# Patient Record
Sex: Female | Born: 1950 | Race: White | Hispanic: No | Marital: Married | Smoking: Never smoker
Health system: Southern US, Community
[De-identification: ages and names within clinical notes are randomized; demographics above are authoritative.]

## PROBLEM LIST (undated history)

## (undated) DIAGNOSIS — C189 Malignant neoplasm of colon, unspecified: Secondary | ICD-10-CM

## (undated) DIAGNOSIS — T7840XA Allergy, unspecified, initial encounter: Secondary | ICD-10-CM

## (undated) DIAGNOSIS — N189 Chronic kidney disease, unspecified: Secondary | ICD-10-CM

## (undated) DIAGNOSIS — E039 Hypothyroidism, unspecified: Secondary | ICD-10-CM

## (undated) DIAGNOSIS — M1990 Unspecified osteoarthritis, unspecified site: Secondary | ICD-10-CM

## (undated) DIAGNOSIS — C50919 Malignant neoplasm of unspecified site of unspecified female breast: Secondary | ICD-10-CM

## (undated) DIAGNOSIS — R195 Other fecal abnormalities: Secondary | ICD-10-CM

## (undated) DIAGNOSIS — K5900 Constipation, unspecified: Secondary | ICD-10-CM

## (undated) HISTORY — DX: Malignant neoplasm of unspecified site of unspecified female breast: C50.919

## (undated) HISTORY — DX: Unspecified osteoarthritis, unspecified site: M19.90

## (undated) HISTORY — PX: TUBAL LIGATION: SHX77

## (undated) HISTORY — PX: COLON SURGERY: SHX602

## (undated) HISTORY — DX: Chronic kidney disease, unspecified: N18.9

## (undated) HISTORY — DX: Hypothyroidism, unspecified: E03.9

## (undated) HISTORY — DX: Allergy, unspecified, initial encounter: T78.40XA

## (undated) HISTORY — PX: BREAST SURGERY: SHX581

## (undated) HISTORY — DX: Malignant neoplasm of colon, unspecified: C18.9

## (undated) HISTORY — PX: TONSILLECTOMY: SUR1361

## (undated) HISTORY — PX: HERNIA REPAIR: SHX51

## (undated) HISTORY — DX: Other fecal abnormalities: R19.5

## (undated) HISTORY — DX: Constipation, unspecified: K59.00

## (undated) HISTORY — PX: CHOLECYSTECTOMY: SHX55

## (undated) HISTORY — PX: APPENDECTOMY: SHX54

---

## 2010-10-16 HISTORY — PX: COLONOSCOPY: SHX174

## 2012-01-01 DIAGNOSIS — C50919 Malignant neoplasm of unspecified site of unspecified female breast: Secondary | ICD-10-CM

## 2012-01-01 HISTORY — DX: Malignant neoplasm of unspecified site of unspecified female breast: C50.919

## 2020-01-01 DIAGNOSIS — C189 Malignant neoplasm of colon, unspecified: Secondary | ICD-10-CM | POA: Insufficient documentation

## 2020-01-01 HISTORY — DX: Malignant neoplasm of colon, unspecified: C18.9

## 2020-09-06 HISTORY — PX: COLONOSCOPY: SHX174

## 2021-07-10 ENCOUNTER — Telehealth: Payer: Self-pay | Admitting: Gastroenterology

## 2021-07-10 DIAGNOSIS — Z85038 Personal history of other malignant neoplasm of large intestine: Secondary | ICD-10-CM

## 2021-07-10 DIAGNOSIS — Z800 Family history of malignant neoplasm of digestive organs: Secondary | ICD-10-CM

## 2021-07-10 NOTE — Telephone Encounter (Signed)
Good morning Dr. Bryan Lemma, patient is requesting to schedule an colonoscopy. Her last colon was 08/2020.  Will be faxing records to [facility] location.  Can you please review and advise on scheduling?  Thank you.

## 2021-07-24 NOTE — Telephone Encounter (Signed)
Per Jabier Gauss, she is resending notes to [facility] office for review

## 2021-07-24 NOTE — Telephone Encounter (Signed)
Patient calling to follow up on record review status.

## 2021-08-07 NOTE — Telephone Encounter (Signed)
I put the records at Dr. Bryan Lemma desk to review.

## 2021-08-15 ENCOUNTER — Telehealth: Payer: Self-pay

## 2021-08-15 NOTE — Telephone Encounter (Signed)
Records received and reviewed and notable for the following:  - Colonoscopy (10/16/2010): Moderate diverticulosis sigmoid colon, grade 1 internal hemorrhoids - Colonoscopy (09/06/2020): 3 cm ulcerating mass in the proximal ascending colon.  Pathology: Invasive adenocarcinoma.  15 mm polyp adjacent to the mass, not resected endoscopically as this would be resected surgically with the mass.  Moderate diverticulosis of the sigmoid, grade 1 internal hemorrhoids - Labs from 09/07/2020: Normal CBC.  CEA 1.7.  Normal CMP (BG 116) - CT abdomen/pelvis (09/08/2020): Liver unremarkable, normal spleen, pancreas.  No evidence of metastasis.  Nodular thickening of ascending colon consistent with history of colon carcinoma. - Robotic Right hemicolectomy (09/12/2020): Path with invasive, moderately differentiated adenocarcinoma measuring 2.6 cm.  Tumor invades submucosa.  12 lymph nodes negative for metastatic carcinoma.  Margins negative for adenoma and malignancy. - 11/11/2020: GI follow-up.  No GI complaints at that time.  Recommended repeat colonoscopy in 08/2021  Past surgical history: - Hernia surgery - Hemorrhoidectomy - Cholecystectomy - Tonsillectomy - Tubal ligation - Right hemicolectomy as above  Family history notable for the following: - Maternal aunt with Crohn's disease/ulcerative colitis - Grandfather with colon cancer  Recommend OV with me or one of the APP's.  Would also benefit from Oncology Clinic referral for 1 year follow-up care.  Gerrit Heck, DO, [facility]

## 2021-08-15 NOTE — Telephone Encounter (Signed)
Called the patient to make an appointment with Dr. Bryan Lemma, or one of the APPS's.

## 2021-08-15 NOTE — Telephone Encounter (Signed)
Opened in an error

## 2021-08-29 ENCOUNTER — Encounter: Payer: Self-pay | Admitting: *Deleted

## 2021-08-31 NOTE — Addendum Note (Signed)
Addended by: Curlene Labrum E on: 08/31/2021 08:55 AM   Modules accepted: Orders

## 2021-09-11 ENCOUNTER — Encounter: Payer: Self-pay | Admitting: *Deleted

## 2021-09-11 NOTE — Progress Notes (Signed)
Patient has a known diagnosis of colon cancer. She is relocating to this area and needs to establish care with a local oncologist. She is already set up with GI and has a colonoscopy scheduled for 09/22/2021. Will schedule patient for one week later to allow for any possible biopsy results.   Reached out to [employer] to introduce myself as the office RN Navigator and explain our new patient process.   Patient expressed confusion as she wants to see Dr Benay Spice. Explained that her referral was sent to this office as she lives in [place]. She declined and would like to see Dr Benay Spice. Message sent to that navigator, Tylene Fantasia.   Oncology Nurse Navigator Documentation  Oncology Nurse Navigator Flowsheets 09/11/2021  Navigator Location [facility]  Referral Date to RadOnc/MedOnc 09/06/2021  Navigator Encounter Type Introductory Phone Call  Patient Visit Type MedOnc  Treatment Phase Post-Tx Follow-up  Barriers/Navigation Needs Coordination of Care;Education  Education Other  Interventions Coordination of Care;Education  Acuity Level 2-Minimal Needs (1-2 Barriers Identified)  Coordination of Care Appts  Education Method Verbal;Written  Time Spent with Patient 45

## 2021-09-12 ENCOUNTER — Encounter: Payer: Self-pay | Admitting: *Deleted

## 2021-09-14 ENCOUNTER — Ambulatory Visit (INDEPENDENT_AMBULATORY_CARE_PROVIDER_SITE_OTHER): Payer: Medicare Other | Admitting: Gastroenterology

## 2021-09-14 ENCOUNTER — Encounter: Payer: Self-pay | Admitting: Gastroenterology

## 2021-09-14 ENCOUNTER — Other Ambulatory Visit: Payer: Self-pay

## 2021-09-14 VITALS — BP 132/82 | HR 64 | Wt 173.2 lb

## 2021-09-14 DIAGNOSIS — K5900 Constipation, unspecified: Secondary | ICD-10-CM | POA: Diagnosis not present

## 2021-09-14 DIAGNOSIS — R194 Change in bowel habit: Secondary | ICD-10-CM

## 2021-09-14 DIAGNOSIS — Z85038 Personal history of other malignant neoplasm of large intestine: Secondary | ICD-10-CM

## 2021-09-14 DIAGNOSIS — K921 Melena: Secondary | ICD-10-CM | POA: Diagnosis not present

## 2021-09-14 MED ORDER — CLENPIQ 10-3.5-12 MG-GM -GM/160ML PO SOLN: 1.0000 | Freq: Once | ORAL | 0 refills | Status: AC

## 2021-09-14 NOTE — Progress Notes (Signed)
Chief Complaint: Symptomatic hemorrhoids, hematochezia, hx of colon cancer    HPI:     Amanda Dickson is a 71 y.o. female with a history of hypothyroidism, breast cancer s/p lumpectomy, colon cancer  s/p robotic right hemicolectomy in 08/2020 as outlined below, prior hemorrhoidectomy, cholecystectomy, tubal ligation, hernia surgery, presenting to the Gastroenterology Clinic for evaluation of hematochezia and colon cancer surveillance.   1 BM last week with BRBPR filling toilet water. No recurrence. Has been having rectal itching that she attributes to history of hemorrhoids.  Does have a history of constipation.  She takes stool softeners, but feels these are less efficacious lately.  Will occasionally have watery, nonbloody stools.  Symptoms seem more common since right hemicolectomy last year.    Of note, she is under high amounts of stress.  Just moved from [place], renovating new home here in [place].  Just drove back/forth to [place] over the last 2 weeks.  Does report decreased water intake.   Scheduled for colonoscopy on 09/22/2021.  Prior pertinent GI history: - Colonoscopy (10/16/2010): Moderate diverticulosis sigmoid colon, grade 1 internal hemorrhoids - Colonoscopy (09/06/2020): 3 cm ulcerating mass in the proximal ascending colon.  Pathology: Invasive adenocarcinoma.  15 mm polyp adjacent to the mass, not resected endoscopically as this would be resected surgically with the mass.  Moderate diverticulosis of the sigmoid, grade 1 internal hemorrhoids - Labs from 09/07/2020: Normal CBC.  CEA 1.7.  Normal CMP (BG 116) - CT abdomen/pelvis (09/08/2020): Liver unremarkable, normal spleen, pancreas.  No evidence of metastasis.  Nodular thickening of ascending colon consistent with history of colon carcinoma. - Robotic Right hemicolectomy (09/12/2020): Path with invasive, moderately differentiated adenocarcinoma measuring 2.6 cm.  Tumor invades submucosa.  12 lymph nodes negative for metastatic  carcinoma.  Margins negative for adenoma and malignancy. - 11/11/2020: GI follow-up.  No GI complaints at that time.  Recommended repeat colonoscopy in 08/2021  Family history notable for the following: - Maternal aunt with Crohn's disease/ulcerative colitis - Grandfather with colon cancer   Past Medical History:  Diagnosis Date   Breast cancer [facility]) 2013   Right, s/p lumpectomy with brachytherapy   Colon cancer ([facility]) 2021   Hypothyroidism      Past Surgical History:  Procedure Laterality Date   CHOLECYSTECTOMY     COLONOSCOPY  09/06/2020   Dr. Larkin Ina - [facility]. Moderate diverticulosis of the sigmoid colon, Mass (3cm) in the proximal ascending colon.   COLONOSCOPY  10/16/2010   Dr. Larkin Ina - [facility] Moderate diverticulosis sigmoid colon, grade 1 internal hemorrhoids   HERNIA REPAIR     TONSILLECTOMY     TUBAL LIGATION     Family History  Problem Relation Age of Onset   Prostate cancer Father    Liver cancer Father    Colon cancer Maternal Grandfather    Crohn's disease Maternal Aunt        Ulcerative colitis   Rectal cancer Neg Hx    Esophageal cancer Neg Hx    Pancreatic cancer Neg Hx    Social History   Tobacco Use   Smoking status: Never   Smokeless tobacco: Never  Vaping Use   Vaping Use: Never used  Substance Use Topics   Alcohol use: Not Currently   Drug use: Never   Current Outpatient Medications  Medication Sig Dispense Refill   calcium gluconate 500 MG tablet Take by mouth. 2 tablets daily  cholecalciferol (VITAMIN D3) 25 MCG (1000 UNIT) tablet Take 1,000 Units by mouth daily.     Docusate Sodium 100 MG capsule Take 100 mg by mouth daily.     FIBER ADULT GUMMIES PO Take by mouth. daily     levothyroxine (SYNTHROID) 50 MCG tablet Take 50 mcg by mouth daily before breakfast.     Multiple Vitamin (MULTIVITAMIN) tablet Take 1 tablet by mouth daily.     Probiotic Product (PROBIOTIC DAILY PO) Take by mouth.  Take one tablet daily     No current facility-administered medications for this visit.   Allergies  Allergen Reactions   Demerol [Meperidine Hcl]     "Feel paralyzed"     Review of Systems: All systems reviewed and negative except where noted in HPI.     Physical Exam:    Wt Readings from Last 3 Encounters:  09/14/21 173 lb 4 oz (78.6 kg)    BP 132/82   Pulse 64   Wt 173 lb 4 oz (78.6 kg)   SpO2 98%  Constitutional:  Pleasant, in no acute distress. Psychiatric: Normal mood and affect. Behavior is normal. Cardiovascular: Normal rate, regular rhythm. No edema Pulmonary/chest: Effort normal and breath sounds normal. No wheezing, rales or rhonchi. Abdominal: Soft, nondistended, nontender. Bowel sounds active throughout. There are no masses palpable. No hepatomegaly. Neurological: Alert and oriented to person place and time. Skin: Skin is warm and dry. No rashes noted.   ASSESSMENT AND PLAN;   1) History of colon cancer s/p right hemicolectomy - Colonoscopy next week for colon cancer surveillance - Has appoint with Dr. Benay Spice on 09/18/2021 to establish care and 1 year follow-up from prior hemicolectomy - Extended bowel preparation with Dulcolax 10 mg bid x2 days, MiraLAX 1 cap bid x2 days, then Clenpiq  2) Constipation 3) Change in bowel habits - Longstanding history of constipation.  Suspect episodic overflow diarrhea based on clinical description - Recent changes in bowel habits could also be related to increased stress loads, long distance travel, etc. - Increase daily water intake to at least 64 ounces of water/day - Trial MiraLAX 1 cap/day and titrate to effect - Plan for extended bowel prep  4) Hematochezia - Evaluate for etiology at time of colonoscopy as above - If clinically significant hemorrhoidal disease, discussed hemorrhoid banding today  The indications, risks, and benefits of colonoscopy were explained to the patient in detail. Risks include but are  not limited to bleeding, perforation, adverse reaction to medications, and cardiopulmonary compromise. Sequelae include but are not limited to the possibility of surgery, hospitalization, and mortality. The patient verbalized understanding and wished to proceed.    [place], DO, FACG  09/14/2021, 2:11 PM   No ref. provider found

## 2021-09-14 NOTE — Patient Instructions (Addendum)
If you are age 73 or older, your body mass index should be between 23-30. Your There is no height or weight on file to calculate BMI. If this is out of the aforementioned range listed, please consider follow up with your Primary Care Provider.  If you are age 33 or younger, your body mass index should be between 19-25. Your There is no height or weight on file to calculate BMI. If this is out of the aformentioned range listed, please consider follow up with your Primary Care Provider.   __________________________________________________________  The [facility] providers would like to encourage you to use [facility] to communicate with providers for non-urgent requests or questions.  Due to long hold times on the telephone, sending your provider a message by [facility] may be a faster and more efficient way to get a response.  Please allow 48 business hours for a response.  Please remember that this is for non-urgent requests.   You have been scheduled for a colonoscopy. Please follow written instructions given to you at your visit today.  Please pick up your prep supplies at the pharmacy within the next 1-3 days. If you use inhalers (even only as needed), please bring them with you on the day of your procedure.   We have given you samples of the following medication to take:  Clenpiq   Dulcolax 10 mg twice daily 2 days prior to procedure  Miralax 1 capful twice daily 2 days prior to procedure.   Please purchase the following medications over the counter and take as directed: Miralax 1 capful once daily starting today.    It was a pleasure to see you today!  Vito Cirigliano, D.O.

## 2021-09-18 ENCOUNTER — Inpatient Hospital Stay: Payer: Medicare Other | Attending: Oncology | Admitting: Oncology

## 2021-09-18 ENCOUNTER — Other Ambulatory Visit: Payer: Self-pay

## 2021-09-18 VITALS — BP 139/74 | HR 73 | Temp 98.1°F | Resp 20 | Ht 65.0 in | Wt 171.6 lb

## 2021-09-18 DIAGNOSIS — C50411 Malignant neoplasm of upper-outer quadrant of right female breast: Secondary | ICD-10-CM | POA: Diagnosis not present

## 2021-09-18 DIAGNOSIS — Z9289 Personal history of other medical treatment: Secondary | ICD-10-CM

## 2021-09-18 DIAGNOSIS — Z1231 Encounter for screening mammogram for malignant neoplasm of breast: Secondary | ICD-10-CM

## 2021-09-18 DIAGNOSIS — Z170 Estrogen receptor positive status [ER+]: Secondary | ICD-10-CM

## 2021-09-18 NOTE — Progress Notes (Signed)
[facility] New Patient Consult   Requesting MD: Raliegh Ip [address] [place],  [place] [address]   Dillan Lunden 71 y.o.  [birth year]    Reason for Consult: Breast cancer, colon cancer   HPI: Ms. Kirsh has a remote history of breast cancer and was diagnosed with colon cancer last year.  She recently relocated to [facility] from [place].  She is referred to establish oncology care.  She feels well.  She is scheduled for colonoscopy later this week.  A bilateral mammogram on 06/23/2021 was negative.  Past Medical History:  Diagnosis Date   Breast cancer ([facility]) stage Ia, T1N0, ER positive, PR positive, HER2 negative 2013   Right, s/p lumpectomy with brachytherapy   Colon cancer ([facility])-proximal right colon, stage I, T1N0 2021   Hypothyroidism     .  G2 P2  Past Surgical History:  Procedure Laterality Date   CHOLECYSTECTOMY     COLONOSCOPY  09/06/2020   Dr. Larkin Ina - [facility]. Moderate diverticulosis of the sigmoid colon, Mass (3cm) in the proximal ascending colon.   COLONOSCOPY  10/16/2010   Dr. Larkin Ina - [facility]. Moderate diverticulosis sigmoid colon, grade 1 internal hemorrhoids   HERNIA REPAIR     TONSILLECTOMY     TUBAL LIGATION      .  Partial colectomy 2021   .  Hemorrhoidectomy 1988  Medications: Reviewed  Allergies:  Allergies  Allergen Reactions   Meperidine Hcl     "Feel paralyzed" Other reaction(s): Other (See Comments) PT STATES IT CAUSES HER TO FEEL LIKE PARALYSIS IS SETTING IN    Family history: Her father had prostate cancer and may have had colon cancer.  Social History:   She lives with her husband and [employer].  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  She does not use cigarettes or alcohol.  She received a transfusion with the cholecystectomy.  No risk factor for HIV or hepatitis.  She has received  COVID-19 vaccines  ROS:    Positives include: Chronic constipation/diarrhea, episode of bright red blood per rectum 1 week ago  A complete ROS was otherwise negative.  Physical Exam:  Blood pressure 139/74, pulse 73, temperature 98.1 F (36.7 C), temperature source Oral, resp. rate 20, height $RemoveBe'5\' 5"'xQstHJsja$  (1.651 m), weight 171 lb 9.6 oz (77.8 kg), SpO2 98 %.  Lungs: Clear bilaterally Cardiac: Regular rate and rhythm Abdomen: No hepatosplenomegaly, no mass, nontender  Vascular: No leg edema Lymph nodes: No cervical, supraclavicular, axillary, or inguinal nodes Neurologic: Alert and oriented Skin: No rash Musculoskeletal: No spine tenderness Breast: Status post right lumpectomy.  Firm tissue surrounding lumpectomy scar.  No mass in either breast.  Both axillae appear benign.       Assessment/Plan:   Right breast cancer-stage Ia (T1N0), status post a right lumpectomy and sentinel lymph node biopsy March 2013 ER 100%, PR 99%, HER2 0 Adjuvant brachytherapy March 2013 Adjuvant aromatase inhibitors including Femara, Aromasin, and Arimidex March 2013-March 2015-discontinued secondary to severe hot flashes and rash, trial of tamoxifen discontinued secondary to severe hot flashes Right colon cancer, stage I, T1N0-status post a right colectomy 09/12/2020, tumor invaded the submucosa, 0/12 lymph nodes, no lymphovascular perineural invasion, mismatch repair protein proficient CT abdomen/pelvis negative for metastases  3.   Negative INVITAE genetic panel 4.   Hypothyroidism 5.   G2 P2  Disposition:   Ms. Whobrey has a history of right-sided breast cancer and  colon cancer.  She is in clinical remission from both cancers.  She will be scheduled for a bilateral mammogram in June 2023.  She will undergo colonoscopy surveillance later this week.  She would like to continue follow-up in the medical oncology clinic.  She will return for an office visit in 1 year.  Betsy Coder, MD  09/18/2021, 2:29 PM

## 2021-09-22 ENCOUNTER — Ambulatory Visit (AMBULATORY_SURGERY_CENTER): Payer: Medicare Other | Admitting: Gastroenterology

## 2021-09-22 ENCOUNTER — Encounter: Payer: Self-pay | Admitting: Gastroenterology

## 2021-09-22 ENCOUNTER — Other Ambulatory Visit: Payer: Self-pay

## 2021-09-22 VITALS — BP 137/84 | HR 57 | Temp 97.0°F | Resp 11 | Ht 65.0 in | Wt 171.0 lb

## 2021-09-22 DIAGNOSIS — K921 Melena: Secondary | ICD-10-CM

## 2021-09-22 DIAGNOSIS — K5730 Diverticulosis of large intestine without perforation or abscess without bleeding: Secondary | ICD-10-CM | POA: Diagnosis not present

## 2021-09-22 DIAGNOSIS — K641 Second degree hemorrhoids: Secondary | ICD-10-CM

## 2021-09-22 DIAGNOSIS — Z85038 Personal history of other malignant neoplasm of large intestine: Secondary | ICD-10-CM

## 2021-09-22 DIAGNOSIS — K5900 Constipation, unspecified: Secondary | ICD-10-CM

## 2021-09-22 MED ORDER — SODIUM CHLORIDE 0.9 % IV SOLN: 500.0000 mL | Freq: Once | INTRAVENOUS | Status: DC

## 2021-09-22 NOTE — Progress Notes (Signed)
Pt's states no medical or surgical changes since previsit or office visit. 

## 2021-09-22 NOTE — Progress Notes (Signed)
Report given to PACU, vss 

## 2021-09-22 NOTE — Patient Instructions (Signed)
Handout given:  diverticulosis Resume previous diet Continue current medications Use supplemental fiber ie:  citrucel, fibercon, konsyl or metamucil  YOU HAD AN ENDOSCOPIC PROCEDURE TODAY AT [facility]:   Refer to the procedure report that was given to you for any specific questions about what was found during the examination.  If the procedure report does not answer your questions, please call your gastroenterologist to clarify.  If you requested that your care partner not be given the details of your procedure findings, then the procedure report has been included in a sealed envelope for you to review at your convenience later.  YOU SHOULD EXPECT: Some feelings of bloating in the abdomen. Passage of more gas than usual.  Walking can help get rid of the air that was put into your GI tract during the procedure and reduce the bloating. If you had a lower endoscopy (such as a colonoscopy or flexible sigmoidoscopy) you may notice spotting of blood in your stool or on the toilet paper. If you underwent a bowel prep for your procedure, you may not have a normal bowel movement for a few days.  Please Note:  You might notice some irritation and congestion in your nose or some drainage.  This is from the oxygen used during your procedure.  There is no need for concern and it should clear up in a day or so.  SYMPTOMS TO REPORT IMMEDIATELY:  Following lower endoscopy (colonoscopy or flexible sigmoidoscopy):  Excessive amounts of blood in the stool  Significant tenderness or worsening of abdominal pains  Swelling of the abdomen that is new, acute  Fever of 100F or higher  For urgent or emergent issues, a gastroenterologist can be reached at any hour by calling [phone]. Do not use MyChart messaging for urgent concerns.   DIET:  We do recommend a small meal at first, but then you may proceed to your regular diet.  Drink plenty of fluids but you should avoid alcoholic beverages for  24 hours.  ACTIVITY:  You should plan to take it easy for the rest of today and you should NOT DRIVE or use heavy machinery until tomorrow (because of the sedation medicines used during the test).    FOLLOW UP: Our staff will call the number listed on your records 48-72 hours following your procedure to check on you and address any questions or concerns that you may have regarding the information given to you following your procedure. If we do not reach you, we will leave a message.  We will attempt to reach you two times.  During this call, we will ask if you have developed any symptoms of COVID 19. If you develop any symptoms (ie: fever, flu-like symptoms, shortness of breath, cough etc.) before then, please call [phone].  If you test positive for Covid 19 in the 2 weeks post procedure, please call and report this information to [place].    If any biopsies were taken you will be contacted by phone or by letter within the next 1-3 weeks.  Please call us at [phone] if you have not heard about the biopsies in 3 weeks.   SIGNATURES/CONFIDENTIALITY: You and/or your care partner have signed paperwork which will be entered into your electronic medical record.  These signatures attest to the fact that that the information above on your After Visit Summary has been reviewed and is understood.  Full responsibility of the confidentiality of this discharge information lies with you and/or your care-partner.

## 2021-09-22 NOTE — Op Note (Signed)
[facility] Patient Name: Amanda Dickson Procedure Date: 09/22/2021 7:47 AM MRN: [id number] Endoscopist: Gerrit Heck , MD Age: 71 Referring MD:  Date of Birth: 1950/10/21 Gender: Female Account #: [id number] Procedure:                Colonoscopy Indications:              High risk colon cancer surveillance: Personal                            history of colon cancer                           Additionally, history of constipation and recent                            episode of hematochezia.                           ?" Colonoscopy (10/16/2010): Moderate diverticulosis                            sigmoid colon, grade 1 internal hemorrhoids                           ?"Colonoscopy (09/06/2020): 3 cm ulcerating mass in                            the proximal ascending colon. Pathology: Invasive                            adenocarcinoma. 15 mm polyp adjacent to the mass,                            not resected endoscopically as this would be                            resected surgically with the mass. Moderate                            diverticulosis of the sigmoid, grade 1 internal                            hemorrhoids                           ?" Labs from 09/07/2020: Normal CBC. CEA 1.7. Normal                            CMP (BG 116)                           ?" CT abdomen/pelvis (09/08/2020): Liver unremarkable,                            normal spleen, pancreas. No evidence of  metastasis. Nodular thickening of ascending colon                            consistent with history of colon carcinoma.                           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 Tumor invades                            submucosa. 12 lymph nodes negative for metastatic                            carcinoma.  Margins negative for adenoma and                            malignancy. Medicines:                Monitored Anesthesia Care Procedure:                Pre-Anesthesia Assessment:                           - Prior to the procedure, a History and Physical                            was performed, and patient medications and                            allergies were reviewed. The patient's tolerance of                            previous anesthesia was also reviewed. The risks                            and benefits of the procedure and the sedation                            options and risks were discussed with the patient.                            All questions were answered, and informed consent                            was obtained. Prior Anticoagulants: The patient has                            taken no previous anticoagulant or antiplatelet  agents. ASA Grade Assessment: II - A patient with                            mild systemic disease. After reviewing the risks                            and benefits, the patient was deemed in                            satisfactory condition to undergo the procedure.                           After obtaining informed consent, the colonoscope                            was passed under direct vision. Throughout the                            procedure, the patient's blood pressure, pulse, and                            oxygen saturations were monitored continuously. The                            CF [id number] #[id number] was introduced through the anus                            and advanced to the the ileocolonic anastomosis.                            The colonoscopy was performed without difficulty.                            The patient tolerated the procedure well. The                            quality of the bowel preparation was good. The                            rectum, ileocolonic anastamosis and neo-terminal                             ileum were photographed. Scope In: 7:56:57 AM Scope Out: 8:13:25 AM Scope Withdrawal Time: 0 hours 13 minutes 19 seconds  Total Procedure Duration: 0 hours 16 minutes 28 seconds  Findings:                 Hemorrhoids and skin tags were found on perianal                            exam.                           There was evidence of a prior end-to-side  ileo-colonic anastomosis in the transverse colon.                            This was patent and was characterized by healthy                            appearing mucosa. The anastomosis was traversed.                           A few small-mouthed diverticula were found in the                            sigmoid colon and descending colon.                           Non-bleeding internal hemorrhoids were found during                            retroflexion. The hemorrhoids were small and Grade                            II (internal hemorrhoids that prolapse but reduce                            spontaneously).                           The neo-terminal ileum appeared normal. Complications:            No immediate complications. Estimated Blood Loss:     Estimated blood loss: none. Impression:               - Hemorrhoids found on perianal exam.                           - Patent end-to-side ileo-colonic anastomosis,                            characterized by healthy appearing mucosa.                           - Diverticulosis in the sigmoid colon and in the                            descending colon.                           - Non-bleeding internal hemorrhoids.                           - The examined portion of the ileum was normal.                           - No specimens collected. Recommendation:           - Patient has a contact number available for  emergencies. The signs and symptoms of potential                            delayed complications were  discussed with the                            patient. Return to normal activities tomorrow.                            Written discharge instructions were provided to the                            patient.                           - Resume previous diet.                           - Continue present medications.                           - Repeat colonoscopy in 3 years for surveillance.                           - Return to GI office PRN.                           - Use fiber, for example Citrucel, Fibercon, Konsyl                            or Metamucil.                           - Internal hemorrhoids were noted on this study and                            may be amenable to hemorrhoid band ligation. If you                            are interested in further treatment of these                            hemorrhoids with band ligation, please contact my                            clinic to set up an appointment for evaluation and                            treatment. Gerrit Heck, MD 09/22/2021 8:22:22 AM

## 2021-09-22 NOTE — Progress Notes (Signed)
GASTROENTEROLOGY PROCEDURE H&P NOTE   Primary Care Physician: Luetta Nutting, DO    Reason for Procedure:  Colon cancer surveillance, hematochezia, constipation  Plan:    Colonoscopy  Patient is appropriate for endoscopic procedure(s) in the ambulatory ([place]) setting.  The nature of the procedure, as well as the risks, benefits, and alternatives were carefully and thoroughly reviewed with the patient. Ample time for discussion and questions allowed. The patient understood, was satisfied, and agreed to proceed.     HPI: Amanda Dickson is a 71 y.o. female with a history of colon cancer diagnosed in 08/2020,  s/p robotic right hemicolectomy 08/2020 (no chemotherapy or radiation), who presents for colonoscopy for ongoing colon cancer surveillance.  Patient was most recently seen in the Gastroenterology Clinic on 09/14/2021.  Has since been seen in the Oncology clinic on 09/18/2021, with plan for 1 year follow-up.  Otherwise, no interval change in medical history since that appointment. Please refer to that note for full details regarding GI history and clinical presentation.   Past Medical History:  Diagnosis Date   Breast cancer [facility]) 2013   Right, s/p lumpectomy with brachytherapy   Colon cancer [facility]) 2021   Hypothyroidism     Past Surgical History:  Procedure Laterality Date   CHOLECYSTECTOMY     COLONOSCOPY  09/06/2020   Dr. Larkin Ina - [facility]. Moderate diverticulosis of the sigmoid colon, Mass (3cm) in the proximal ascending colon.   COLONOSCOPY  10/16/2010   Dr. Larkin Ina - [facility]. Moderate diverticulosis sigmoid colon, grade 1 internal hemorrhoids   HERNIA REPAIR     TONSILLECTOMY     TUBAL LIGATION      Prior to Admission medications   Medication Sig Start Date End Date Taking? Authorizing Provider  calcium gluconate 500 MG tablet Take by mouth. 2 tablets daily   Yes [provider]  cholecalciferol (VITAMIN D3) 25  MCG (1000 UNIT) tablet Take 1,000 Units by mouth daily.   Yes [provider]  Fiber Adult Gummies 2 g CHEW Chew 2 each by mouth daily.   Yes [provider]  levothyroxine (SYNTHROID) 50 MCG tablet Take 50 mcg by mouth daily before breakfast.   Yes [provider]  Multiple Vitamins-Minerals (MULTI-VITAMIN GUMMIES) CHEW Chew 2 tablets by mouth daily.   Yes [provider]  polyethylene glycol (MIRALAX / GLYCOLAX) 17 g packet Take 17 g by mouth daily.   Yes [provider]  Probiotic Product (PROBIOTIC DAILY PO) Take by mouth. Take one capsule daily   Yes [provider]    Current Outpatient Medications  Medication Sig Dispense Refill   calcium gluconate 500 MG tablet Take by mouth. 2 tablets daily     cholecalciferol (VITAMIN D3) 25 MCG (1000 UNIT) tablet Take 1,000 Units by mouth daily.     Fiber Adult Gummies 2 g CHEW Chew 2 each by mouth daily.     levothyroxine (SYNTHROID) 50 MCG tablet Take 50 mcg by mouth daily before breakfast.     Multiple Vitamins-Minerals (MULTI-VITAMIN GUMMIES) CHEW Chew 2 tablets by mouth daily.     polyethylene glycol (MIRALAX / GLYCOLAX) 17 g packet Take 17 g by mouth daily.     Probiotic Product (PROBIOTIC DAILY PO) Take by mouth. Take one capsule daily     Current Facility-Administered Medications  Medication Dose Route Frequency Provider Last Rate Last Admin   0.9 %  sodium chloride infusion  500 mL Intravenous Once Katheren Jimmerson, [place], DO  Allergies as of 09/22/2021 - Review Complete 09/22/2021  Allergen Reaction Noted   Meperidine hcl  03/17/2012    Family History  Problem Relation Age of Onset   Prostate cancer Father    Liver cancer Father    Crohn's disease Maternal Aunt        Ulcerative colitis   Colon cancer Maternal Grandfather    Rectal cancer Neg Hx    Esophageal cancer Neg Hx    Pancreatic cancer Neg Hx    Stomach cancer Neg Hx     Social History   Socioeconomic  History   Marital status: Married    Spouse name: Not on file   Number of children: 2   Years of education: Not on file   Highest education level: Not on file  Occupational History   Occupation: Retired   Occupation: Retired  Tobacco Use   Smoking status: Never   Smokeless tobacco: Never  Scientific laboratory technician Use: Never used  Substance and Sexual Activity   Alcohol use: Not Currently   Drug use: Never   Sexual activity: Not on file  Other Topics Concern   Not on file  Social History Narrative   Not on file   Social Determinants of Health   Financial Resource Strain: Not on file  Food Insecurity: Not on file  Transportation Needs: Not on file  Physical Activity: Not on file  Stress: Not on file  Social Connections: Not on file  Intimate Partner Violence: Not on file    Physical Exam: Vital signs in last 24 hours: @BP  (!) 149/94   Pulse (!) 58   Temp (!) 97 F (36.1 C) (Temporal)   Ht 5\' 5"  (1.651 m)   Wt 171 lb (77.6 kg)   LMP  (Exact Date)   SpO2 98%   BMI 28.46 kg/m  GEN: NAD EYE: Sclerae anicteric ENT: MMM CV: Non-tachycardic Pulm: CTA b/l GI: Soft, NT/ND NEURO:  Alert & Oriented x [place], DO [facility]   09/22/2021 7:35 AM

## 2021-09-25 ENCOUNTER — Telehealth: Payer: Self-pay

## 2021-09-25 NOTE — Telephone Encounter (Signed)
  Follow up Call-  Call back number 09/22/2021  Post procedure Call Back phone  # [phone]  Permission to leave phone message Yes     Patient questions:  Do you have a fever, pain , or abdominal swelling? No. Pain Score  0 *  Have you tolerated food without any problems? Yes.    Have you been able to return to your normal activities? Yes.    Do you have any questions about your discharge instructions: Diet   No. Medications  No. Follow up visit  No.  Do you have questions or concerns about your Care? No.  Actions: * If pain score is 4 or above: No action needed, pain <4.

## 2021-09-29 ENCOUNTER — Ambulatory Visit: Payer: Self-pay | Admitting: Hematology & Oncology

## 2021-09-29 ENCOUNTER — Other Ambulatory Visit: Payer: Self-pay

## 2021-10-18 ENCOUNTER — Ambulatory Visit (INDEPENDENT_AMBULATORY_CARE_PROVIDER_SITE_OTHER): Payer: Medicare Other | Admitting: Sports Medicine

## 2021-10-18 ENCOUNTER — Other Ambulatory Visit: Payer: Self-pay

## 2021-10-18 ENCOUNTER — Ambulatory Visit (INDEPENDENT_AMBULATORY_CARE_PROVIDER_SITE_OTHER): Payer: Medicare Other

## 2021-10-18 DIAGNOSIS — G8929 Other chronic pain: Secondary | ICD-10-CM

## 2021-10-18 DIAGNOSIS — M25552 Pain in left hip: Secondary | ICD-10-CM

## 2021-10-18 HISTORY — DX: Pain in left hip: M25.552

## 2021-10-18 MED ORDER — CELECOXIB 200 MG PO CAPS: ORAL_CAPSULE | ORAL | 2 refills | Status: DC

## 2021-10-18 NOTE — Assessment & Plan Note (Signed)
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 She does endorse that some of the pain goes down to her lateral ankle. On exam she has tenderness over the greater trochanter on the left but similar so on the right. She does have significant loss of motion with internal rotation and reproduction of pain on the left compared to the right. 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 Return to see me in 4 to 6 weeks, hip joint injection if no better, if all of the above fails we will consider her lumbar spine as a pain generator.

## 2021-10-18 NOTE — Progress Notes (Signed)
    Procedures performed today:    None.  Independent interpretation of notes and tests performed by another provider:   None.  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 She does endorse that some of the pain goes down to her lateral ankle. On exam she has tenderness over the greater trochanter on the left but similar so on the right. She does have significant loss of motion with internal rotation and reproduction of pain on the left compared to the right. 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 Return to see me in 4 to 6 weeks, hip joint injection if no better, if all of the above fails we will consider her lumbar spine as a pain generator.  Chronic process with exacerbation and pharmacologic intervention  ___________________________________________ Gwen Her. Dianah Field, M.D., ABFM., CAQSM. Primary Care and [place] Instructor of [place] of [facility] of Medicine

## 2021-10-23 ENCOUNTER — Other Ambulatory Visit: Payer: Self-pay

## 2021-10-23 ENCOUNTER — Encounter: Payer: Self-pay | Admitting: Family Medicine

## 2021-10-23 ENCOUNTER — Ambulatory Visit (INDEPENDENT_AMBULATORY_CARE_PROVIDER_SITE_OTHER): Payer: Medicare Other | Admitting: Family Medicine

## 2021-10-23 ENCOUNTER — Ambulatory Visit: Payer: Self-pay | Admitting: Family Medicine

## 2021-10-23 VITALS — BP 146/78 | HR 58 | Temp 97.8°F | Ht 65.16 in | Wt 172.1 lb

## 2021-10-23 DIAGNOSIS — Z85038 Personal history of other malignant neoplasm of large intestine: Secondary | ICD-10-CM

## 2021-10-23 DIAGNOSIS — E039 Hypothyroidism, unspecified: Secondary | ICD-10-CM

## 2021-10-23 DIAGNOSIS — Z23 Encounter for immunization: Secondary | ICD-10-CM | POA: Diagnosis not present

## 2021-10-23 DIAGNOSIS — Z13220 Encounter for screening for lipoid disorders: Secondary | ICD-10-CM

## 2021-10-23 DIAGNOSIS — Z9049 Acquired absence of other specified parts of digestive tract: Secondary | ICD-10-CM | POA: Insufficient documentation

## 2021-10-23 DIAGNOSIS — Z853 Personal history of malignant neoplasm of breast: Secondary | ICD-10-CM

## 2021-10-23 HISTORY — DX: Personal history of malignant neoplasm of breast: Z85.3

## 2021-10-23 HISTORY — DX: Personal history of other malignant neoplasm of large intestine: Z85.038

## 2021-10-23 HISTORY — DX: Hypothyroidism, unspecified: E03.9

## 2021-10-23 HISTORY — DX: Acquired absence of other specified parts of digestive tract: Z90.49

## 2021-10-23 NOTE — Patient Instructions (Signed)
Great to meet you today! We'll be in touch with lab results.  We'll plan to follow up in 6 months or sooner if needed.

## 2021-10-23 NOTE — Assessment & Plan Note (Signed)
She feels well with current dose of levothyroxine.  Updating TSH today.

## 2021-10-23 NOTE — Assessment & Plan Note (Signed)
Status post right hemicolectomy.  Recent colonoscopy without any new or additional findings.  She will continue to follow with gastroenterology and oncology.

## 2021-10-23 NOTE — Assessment & Plan Note (Signed)
She will continue to have regular mammograms and follow-up with oncology.

## 2021-10-23 NOTE — Progress Notes (Signed)
Amanda Dickson - 71 y.o. female MRN [id number]  Date of birth: 1950-09-20  Subjective Chief Complaint  Patient presents with   Establish Care    HPI Amanda Dickson is a 71 year old female here today for initial visit to establish care.  She and her husband recently moved to the area from [place].  Overall she has been in fairly good health.  She has a history of hypothyroidism and is on thyroid replacement with levothyroxine.  She has had a history of breast cancer with lumpectomy and brachytherapy.  She has remained up-to-date with her mammograms.  She also has a history of colon cancer status post right hemicolectomy.  She has already established with oncology and gastroenterology here.  She recently had colonoscopy last month.  She does have some mild constipation that is improved with addition of MiraLAX.  She would like to have updated labs today.  ROS:  A comprehensive ROS was completed and negative except as noted per HPI  Allergies  Allergen Reactions   Meperidine Hcl     Paralysis feelings    Past Medical History:  Diagnosis Date   Breast cancer [facility]) 2013   Right, s/p lumpectomy with brachytherapy   Colon cancer ([place]) 2021   Hypothyroidism     Past Surgical History:  Procedure Laterality Date   CHOLECYSTECTOMY     COLONOSCOPY  09/06/2020   Dr. Larkin Ina - [facility]. Moderate diverticulosis of the sigmoid colon, Mass (3cm) in the proximal ascending colon.   COLONOSCOPY  10/16/2010   Dr. Larkin Ina - [facility]. Moderate diverticulosis sigmoid colon, grade 1 internal hemorrhoids   HERNIA REPAIR     TONSILLECTOMY     TUBAL LIGATION      Social History   Socioeconomic History   Marital status: Married    Spouse name: Penne Rosenstock   Number of children: 2   Years of education: Not on file   Highest education level: Not on file  Occupational History   Occupation: Retired   Occupation: Retired  Tobacco Use   Smoking status: Never     Passive exposure: Never   Smokeless tobacco: Never  Vaping Use   Vaping Use: Never used  Substance and Sexual Activity   Alcohol use: Not Currently   Drug use: Never   Sexual activity: Yes    Partners: Male  Other Topics Concern   Not on file  Social History Narrative   Not on file   Social Determinants of Health   Financial Resource Strain: Not on file  Food Insecurity: Not on file  Transportation Needs: Not on file  Physical Activity: Not on file  Stress: Not on file  Social Connections: Not on file    Family History  Problem Relation Age of Onset   Melanoma Mother    Colon cancer Father    Prostate cancer Father    Liver cancer Father    Colon cancer Maternal Grandfather    Crohn's disease Maternal Aunt        Ulcerative colitis   Rectal cancer Neg Hx    Esophageal cancer Neg Hx    Pancreatic cancer Neg Hx    Stomach cancer Neg Hx     Health Maintenance  Topic Date Due   COVID-19 Vaccine (1) Never done   Pneumonia Vaccine 45+ Years old (1 - PCV) 11/24/1956   Hepatitis C Screening  Never done   TETANUS/TDAP  Never done   MAMMOGRAM  Never done   DEXA SCAN  10/24/2023 (Originally 11/24/2013)   Zoster Vaccines- Shingrix (2 of 2) 10/26/2021   COLONOSCOPY (Pts 45-51yrs Insurance coverage will need to be confirmed)  09/22/2024   INFLUENZA VACCINE  Completed   HPV VACCINES  Aged Out     ----------------------------------------------------------------------------------------------------------------------------------------------------------------------------------------------------------------- Physical Exam BP (!) 146/78 (BP Location: Right Arm, Patient Position: Sitting, Cuff Size: Normal)   Pulse (!) 58   Temp 97.8 F (36.6 C)   Ht 5' 5.16" (1.655 m)   Wt 172 lb 1.6 oz (78.1 kg)   SpO2 100%   BMI 28.50 kg/m   Physical Exam Constitutional:      Appearance: Normal appearance.  HENT:     Head: Normocephalic and atraumatic.  Eyes:     General: No  scleral icterus. Cardiovascular:     Rate and Rhythm: Normal rate and regular rhythm.     Pulses: Normal pulses.     Heart sounds: Normal heart sounds.  Abdominal:     General: Abdomen is flat. There is no distension.     Palpations: Abdomen is soft.     Tenderness: There is no abdominal tenderness.  Musculoskeletal:     Cervical back: Neck supple.  Skin:    General: Skin is warm and dry.  Neurological:     General: No focal deficit present.     Mental Status: She is alert.  Psychiatric:        Mood and Affect: Mood normal.        Behavior: Behavior normal.    ------------------------------------------------------------------------------------------------------------------------------------------------------------------------------------------------------------------- Assessment and Plan  Acquired hypothyroidism She feels well with current dose of levothyroxine.  Updating TSH today.  History of colon cancer Status post right hemicolectomy.  Recent colonoscopy without any new or additional findings.  She will continue to follow with gastroenterology and oncology.  History of breast cancer She will continue to have regular mammograms and follow-up with oncology.   No orders of the defined types were placed in this encounter.   No follow-ups on file.    This visit occurred during the SARS-CoV-2 public health emergency.  Safety protocols were in place, including screening questions prior to the visit, additional usage of staff PPE, and extensive cleaning of exam room while observing appropriate contact time as indicated for disinfecting solutions.

## 2021-10-24 LAB — COMPLETE METABOLIC PANEL WITH GFR
AG Ratio: 1.5 (calc) (ref 1.0–2.5)
ALT: 13 U/L (ref 6–29)
AST: 18 U/L (ref 10–35)
Albumin: 4.1 g/dL (ref 3.6–5.1)
Alkaline phosphatase (APISO): 71 U/L (ref 37–153)
BUN: 15 mg/dL (ref 7–25)
CO2: 29 mmol/L (ref 20–32)
Calcium: 9.6 mg/dL (ref 8.6–10.4)
Chloride: 105 mmol/L (ref 98–110)
Creat: 0.82 mg/dL (ref 0.60–1.00)
Globulin: 2.8 g/dL (calc) (ref 1.9–3.7)
Glucose, Bld: 108 mg/dL — ABNORMAL HIGH (ref 65–99)
Potassium: 4.2 mmol/L (ref 3.5–5.3)
Sodium: 142 mmol/L (ref 135–146)
Total Bilirubin: 0.5 mg/dL (ref 0.2–1.2)
Total Protein: 6.9 g/dL (ref 6.1–8.1)
eGFR: 76 mL/min/{1.73_m2} (ref >=60)

## 2021-10-24 LAB — LIPID PANEL W/REFLEX DIRECT LDL
Cholesterol: 220 mg/dL — ABNORMAL HIGH (ref <200)
HDL: 68 mg/dL (ref >=50)
LDL Cholesterol (Calc): 129 mg/dL (calc) — ABNORMAL HIGH
Non-HDL Cholesterol (Calc): 152 mg/dL (calc) — ABNORMAL HIGH (ref <130)
Total CHOL/HDL Ratio: 3.2 (calc) (ref <5.0)
Triglycerides: 122 mg/dL (ref <150)

## 2021-10-24 LAB — CBC WITH DIFFERENTIAL/PLATELET
Absolute Monocytes: 264 cells/uL (ref 200–950)
Basophils Absolute: 19 cells/uL (ref 0–200)
Basophils Relative: 0.4 %
Eosinophils Absolute: 62 cells/uL (ref 15–500)
Eosinophils Relative: 1.3 %
HCT: 44.7 % (ref 35.0–45.0)
Hemoglobin: 15.1 g/dL (ref 11.7–15.5)
Lymphs Abs: 1166 cells/uL (ref 850–3900)
MCH: 29.6 pg (ref 27.0–33.0)
MCHC: 33.8 g/dL (ref 32.0–36.0)
MCV: 87.6 fL (ref 80.0–100.0)
MPV: 10 fL (ref 7.5–12.5)
Monocytes Relative: 5.5 %
Neutro Abs: 3288 cells/uL (ref 1500–7800)
Neutrophils Relative %: 68.5 %
Platelets: 227 10*3/uL (ref 140–400)
RBC: 5.1 10*6/uL (ref 3.80–5.10)
RDW: 12.2 % (ref 11.0–15.0)
Total Lymphocyte: 24.3 %
WBC: 4.8 10*3/uL (ref 3.8–10.8)

## 2021-10-24 LAB — TSH: TSH: 0.89 mIU/L (ref 0.40–4.50)

## 2021-11-15 ENCOUNTER — Other Ambulatory Visit: Payer: Self-pay

## 2021-11-15 ENCOUNTER — Ambulatory Visit (INDEPENDENT_AMBULATORY_CARE_PROVIDER_SITE_OTHER): Payer: Medicare Other | Admitting: Sports Medicine

## 2021-11-15 DIAGNOSIS — M25552 Pain in left hip: Secondary | ICD-10-CM | POA: Diagnosis not present

## 2021-11-15 NOTE — Assessment & Plan Note (Signed)
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 On exam she did have tenderness over the greater trochanter, overall good motion of her hip today. Celebrex has helped her back but not her hip, she admits that she has not been as diligent with her conditioning exercises as she could have been, and would like at least another month or 6 weeks to get back diligent now that her move is coming to a close. If insufficient improvement of the follow-up we will proceed with MRI of the left hip.

## 2021-11-15 NOTE — Progress Notes (Signed)
    Procedures performed today:    None.  Independent interpretation of notes and tests performed by another provider:   None.  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 On exam she did have tenderness over the greater trochanter, overall good motion of her hip today. Celebrex has helped her back but not her hip, she admits that she has not been as diligent with her conditioning exercises as she could have been, and would like at least another month or 6 weeks to get back diligent now that her move is coming to a close. If insufficient improvement of the follow-up we will proceed with MRI of the left hip.    ___________________________________________ Gwen Her. Dianah Field, M.D., ABFM., CAQSM. Primary Care and [place] Instructor of [place] of [facility] of Medicine

## 2022-01-03 ENCOUNTER — Ambulatory Visit (INDEPENDENT_AMBULATORY_CARE_PROVIDER_SITE_OTHER): Payer: Medicare Other | Admitting: Sports Medicine

## 2022-01-03 ENCOUNTER — Other Ambulatory Visit: Payer: Self-pay

## 2022-01-03 DIAGNOSIS — M25552 Pain in left hip: Secondary | ICD-10-CM | POA: Diagnosis not present

## 2022-01-03 MED ORDER — CELECOXIB 200 MG PO CAPS: 200.0000 mg | ORAL_CAPSULE | Freq: Two times a day (BID) | ORAL | 3 refills | Status: DC | PRN

## 2022-01-03 NOTE — Assessment & Plan Note (Signed)
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 Celebrex is helpful, she would like some more, she has still not been as diligent with her conditioning exercises, she would like to wait another 2 to 3 weeks and if not fully diligent we can pull the trigger for an MRI and formal physical therapy.

## 2022-01-03 NOTE — Progress Notes (Signed)
° ° °  Procedures performed today:    None.  Independent interpretation of notes and tests performed by another provider:   None.  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 Celebrex is helpful, she would like some more, she has still not been as diligent with her conditioning exercises, she would like to wait another 2 to 3 weeks and if not fully diligent we can pull the trigger for an MRI and formal physical therapy.  Chronic process with exacerbation and pharmacologic intervention  ___________________________________________ Gwen Her. Dianah Field, M.D., ABFM., CAQSM. Primary Care and [place] Instructor of [place] of [facility] of Medicine

## 2022-01-18 ENCOUNTER — Ambulatory Visit (INDEPENDENT_AMBULATORY_CARE_PROVIDER_SITE_OTHER): Payer: Medicare Other

## 2022-01-18 ENCOUNTER — Other Ambulatory Visit: Payer: Self-pay

## 2022-01-18 ENCOUNTER — Ambulatory Visit (INDEPENDENT_AMBULATORY_CARE_PROVIDER_SITE_OTHER): Payer: Medicare Other | Admitting: Sports Medicine

## 2022-01-18 DIAGNOSIS — M25552 Pain in left hip: Secondary | ICD-10-CM | POA: Diagnosis not present

## 2022-01-18 DIAGNOSIS — G8929 Other chronic pain: Secondary | ICD-10-CM

## 2022-01-18 DIAGNOSIS — M545 Low back pain, unspecified: Secondary | ICD-10-CM | POA: Diagnosis not present

## 2022-01-18 NOTE — Assessment & Plan Note (Signed)
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 Celebrex was helpful, refilled this at the last visit, we did advise her to get more diligent with her conditioning exercises, she returns today doing significantly better, essentially no pain at rest, she really has not gotten out to test the hip though. She is complaining of increasing back pain so I would like her to add some lumbar spine conditioning as well as x-rays. She can return to see me as needed. Of note they are planning a trip this year to [place] all over the [place], [place], [place], [place] If she is having increasing pain prior to her trip I am happy to do a burst of prednisone.

## 2022-01-18 NOTE — Progress Notes (Signed)
° ° °  Procedures performed today:    None.  Independent interpretation of notes and tests performed by another provider:   None.  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 Celebrex was helpful, refilled this at the last visit, we did advise her to get more diligent with her conditioning exercises, she returns today doing significantly better, essentially no pain at rest, she really has not gotten out to test the hip though. She is complaining of increasing back pain so I would like her to add some lumbar spine conditioning as well as x-rays. She can return to see me as needed. Of note they are planning a trip this year to [place] all over the [place], [place], [place], [place] If she is having increasing pain prior to her trip I am happy to do a burst of prednisone.    ___________________________________________ Gwen Her. Dianah Field, M.D., ABFM., CAQSM. Primary Care and [place] Instructor of [place] of [facility] of Medicine

## 2022-02-15 ENCOUNTER — Telehealth: Payer: Self-pay

## 2022-02-15 DIAGNOSIS — D229 Melanocytic nevi, unspecified: Secondary | ICD-10-CM

## 2022-02-15 NOTE — Telephone Encounter (Signed)
Created a referral due to patient request. Pt wants to ensure spot on arm is not skin cancer.

## 2022-03-23 ENCOUNTER — Other Ambulatory Visit: Payer: Self-pay | Admitting: Family Medicine

## 2022-04-23 ENCOUNTER — Ambulatory Visit: Payer: Medicare Other | Admitting: Family Medicine

## 2022-04-23 ENCOUNTER — Ambulatory Visit (INDEPENDENT_AMBULATORY_CARE_PROVIDER_SITE_OTHER): Payer: Medicare Other

## 2022-04-23 ENCOUNTER — Ambulatory Visit (INDEPENDENT_AMBULATORY_CARE_PROVIDER_SITE_OTHER): Payer: Medicare Other | Admitting: Sports Medicine

## 2022-04-23 DIAGNOSIS — R0789 Other chest pain: Secondary | ICD-10-CM

## 2022-04-23 DIAGNOSIS — M47812 Spondylosis without myelopathy or radiculopathy, cervical region: Secondary | ICD-10-CM | POA: Diagnosis not present

## 2022-04-23 HISTORY — DX: Spondylosis without myelopathy or radiculopathy, cervical region: M47.812

## 2022-04-23 LAB — COMPREHENSIVE METABOLIC PANEL
AG Ratio: 1.9 (calc) (ref 1.0–2.5)
ALT: 14 U/L (ref 6–29)
AST: 20 U/L (ref 10–35)
Albumin: 4.3 g/dL (ref 3.6–5.1)
Alkaline phosphatase (APISO): 77 U/L (ref 37–153)
BUN: 14 mg/dL (ref 7–25)
CO2: 32 mmol/L (ref 20–32)
Calcium: 10 mg/dL (ref 8.6–10.4)
Chloride: 103 mmol/L (ref 98–110)
Creat: 0.88 mg/dL (ref 0.60–1.00)
Globulin: 2.3 g/dL (calc) (ref 1.9–3.7)
Glucose, Bld: 94 mg/dL (ref 65–99)
Potassium: 4.4 mmol/L (ref 3.5–5.3)
Sodium: 140 mmol/L (ref 135–146)
Total Bilirubin: 0.3 mg/dL (ref 0.2–1.2)
Total Protein: 6.6 g/dL (ref 6.1–8.1)

## 2022-04-23 LAB — D-DIMER, QUANTITATIVE: D-Dimer, Quant: 0.21 mcg/mL FEU (ref <0.50)

## 2022-04-23 MED ORDER — PREDNISONE 50 MG PO TABS: ORAL_TABLET | ORAL | 0 refills | Status: DC

## 2022-04-23 NOTE — Progress Notes (Signed)
? ? ?  Procedures performed today:   ? ?None. ? ?Independent interpretation of notes and tests performed by another provider:  ? ?None. ? ?Brief History, Exam, Impression, and Recommendations:   ? ?Cervical spondylosis ?Pleasant 72 year old female, she has had a few months of pain posterior chest wall with radiation down the left arm, she did have a long trip recently and is concerned about a cardiac or pulmonary process. ?Pain is not pleuritic, not worse with exertion, no nausea, diaphoresis, she really has no chest pain, it is posterior. ?Because of her long trip we will go ahead and get a D-dimer and a CMP. ?I do however think this is more of a cervical radicular process, adding 5 days of prednisone, x-rays, home conditioning, return to see me in 6 weeks, MRI for interventional planning if no better. ? ?Chronic process with exacerbation and pharmacologic intervention ? ?___________________________________________ ?Gwen Her. Dianah Field, M.D., ABFM., CAQSM. ?Primary Care and Sports Medicine ?[place] ? ?Adjunct Instructor of Family Medicine  ?[employer] of [employer] ?

## 2022-04-23 NOTE — Assessment & Plan Note (Signed)
Pleasant 72 year old female, she has had a few months of pain posterior chest wall with radiation down the left arm, she did have a long trip recently and is concerned about a cardiac or pulmonary process. ?Pain is not pleuritic, not worse with exertion, no nausea, diaphoresis, she really has no chest pain, it is posterior. ?Because of her long trip we will go ahead and get a D-dimer and a CMP. ?I do however think this is more of a cervical radicular process, adding 5 days of prednisone, x-rays, home conditioning, return to see me in 6 weeks, MRI for interventional planning if no better. ?

## 2022-05-01 ENCOUNTER — Ambulatory Visit: Payer: Medicare Other | Admitting: Sports Medicine

## 2022-05-24 ENCOUNTER — Encounter: Payer: Self-pay | Admitting: Family Medicine

## 2022-05-24 ENCOUNTER — Ambulatory Visit (INDEPENDENT_AMBULATORY_CARE_PROVIDER_SITE_OTHER): Payer: Medicare Other | Admitting: Family Medicine

## 2022-05-24 VITALS — BP 157/76 | HR 65 | Ht 65.16 in | Wt 188.0 lb

## 2022-05-24 DIAGNOSIS — R7309 Other abnormal glucose: Secondary | ICD-10-CM | POA: Insufficient documentation

## 2022-05-24 DIAGNOSIS — E039 Hypothyroidism, unspecified: Secondary | ICD-10-CM

## 2022-05-24 HISTORY — DX: Other abnormal glucose: R73.09

## 2022-05-24 LAB — POCT GLYCOSYLATED HEMOGLOBIN (HGB A1C): Hemoglobin A1C: 5.6 % (ref 4.0–5.6)

## 2022-05-24 NOTE — Patient Instructions (Signed)
Great to see you today! We'll plan to follow up in 6 months.  We'll check labs at your next visit.

## 2022-05-24 NOTE — Assessment & Plan Note (Signed)
Doing well at this time with current dose of levothyroxine.  Recommend continuation.

## 2022-05-24 NOTE — Progress Notes (Signed)
Amanda Dickson - 72 y.o. female MRN [id number]  Date of birth: 10-Feb-1950  Subjective No chief complaint on file.   HPI Amanda Dickson is a 72 year old female here today for follow-up visit.  Reports overall she is doing well.  Continues to do well with current dose of levothyroxine.  She is taking this daily as directed.  Blood glucose elevated on previous lab work.  She reports she has been frequenting [employer] was and is concerned that her blood sugars may be elevated.  She did see Dr. Dianah Field recently for shoulder pain.  Is working on postural exercises which helped significantly with her pain.  ROS:  A comprehensive ROS was completed and negative except as noted per HPI  Allergies  Allergen Reactions   Meperidine Hcl     Paralysis feelings    Past Medical History:  Diagnosis Date   Breast cancer [facility]) 2013   Right, s/p lumpectomy with brachytherapy   Colon cancer ([place]) 2021   Hypothyroidism     Past Surgical History:  Procedure Laterality Date   CHOLECYSTECTOMY     COLONOSCOPY  09/06/2020   Dr. Larkin Ina - [facility]. Moderate diverticulosis of the sigmoid colon, Mass (3cm) in the proximal ascending colon.   COLONOSCOPY  10/16/2010   Dr. Larkin Ina - [facility]. Moderate diverticulosis sigmoid colon, grade 1 internal hemorrhoids   HERNIA REPAIR     TONSILLECTOMY     TUBAL LIGATION      Social History   Socioeconomic History   Marital status: Married    Spouse name: Ashari Llewellyn   Number of children: 2   Years of education: Not on file   Highest education level: Not on file  Occupational History   Occupation: Retired   Occupation: Retired  Tobacco Use   Smoking status: Never    Passive exposure: Never   Smokeless tobacco: Never  Vaping Use   Vaping Use: Never used  Substance and Sexual Activity   Alcohol use: Not Currently   Drug use: Never   Sexual activity: Yes    Partners: Male  Other Topics Concern   Not on  file  Social History Narrative   Not on file   Social Determinants of Health   Financial Resource Strain: Not on file  Food Insecurity: Not on file  Transportation Needs: Not on file  Physical Activity: Not on file  Stress: Not on file  Social Connections: Not on file    Family History  Problem Relation Age of Onset   Melanoma Mother    Colon cancer Father    Prostate cancer Father    Liver cancer Father    Colon cancer Maternal Grandfather    Crohn's disease Maternal Aunt        Ulcerative colitis   Rectal cancer Neg Hx    Esophageal cancer Neg Hx    Pancreatic cancer Neg Hx    Stomach cancer Neg Hx     Health Maintenance  Topic Date Due   MAMMOGRAM  Never done   COVID-19 Vaccine (2 - Pfizer risk series) 08/31/2022 (Originally 12/06/2021)   Pneumonia Vaccine 41+ Years old (1 - PCV) 05/25/2023 (Originally 11/24/2013)   TETANUS/TDAP  05/25/2023 (Originally 11/25/1967)   Hepatitis C Screening  05/25/2023 (Originally 11/24/1966)   DEXA SCAN  10/24/2023 (Originally 11/24/2013)   INFLUENZA VACCINE  07/31/2022   COLONOSCOPY (Pts 45-68yr Insurance coverage will need to be confirmed)  09/22/2024   Zoster Vaccines- Shingrix  Completed   HPV VACCINES  Aged Out     ----------------------------------------------------------------------------------------------------------------------------------------------------------------------------------------------------------------- Physical Exam BP (!) 157/76 (BP Location: Left Arm, Patient Position: Sitting, Cuff Size: Normal)   Pulse 65   Ht 5' 5.16" (1.655 m)   Wt 188 lb (85.3 kg)   SpO2 95%   BMI 31.13 kg/m   Physical Exam Constitutional:      Appearance: Normal appearance.  Musculoskeletal:     Cervical back: Neck supple.  Neurological:     Mental Status: She is alert.  Psychiatric:        Mood and Affect: Mood normal.        Behavior: Behavior normal.     ------------------------------------------------------------------------------------------------------------------------------------------------------------------------------------------------------------------- Assessment and Plan  Acquired hypothyroidism Doing well at this time with current dose of levothyroxine.  Recommend continuation.  Elevated glucose A1c remains within normal limits at this time at 5.6%.  We discussed working on dietary changes to help with controlling blood sugars.   No orders of the defined types were placed in this encounter.   Return in about 6 months (around 11/24/2022) for Hypothyroid/labs.    This visit occurred during the SARS-CoV-2 public health emergency.  Safety protocols were in place, including screening questions prior to the visit, additional usage of staff PPE, and extensive cleaning of exam room while observing appropriate contact time as indicated for disinfecting solutions.

## 2022-05-24 NOTE — Assessment & Plan Note (Signed)
A1c remains within normal limits at this time at 5.6%.  We discussed working on dietary changes to help with controlling blood sugars.

## 2022-06-04 ENCOUNTER — Ambulatory Visit (INDEPENDENT_AMBULATORY_CARE_PROVIDER_SITE_OTHER): Payer: Medicare Other | Admitting: Sports Medicine

## 2022-06-04 DIAGNOSIS — M25552 Pain in left hip: Secondary | ICD-10-CM

## 2022-06-04 DIAGNOSIS — M47812 Spondylosis without myelopathy or radiculopathy, cervical region: Secondary | ICD-10-CM

## 2022-06-04 NOTE — Assessment & Plan Note (Signed)
[name], we talked about her hip pain today, she has years of pain left hip, historically diagnosis of greater trochanteric bursitis, she had physical therapy, some steroid injections, Tenex, none of which seem to work. Clinically she does have pain directly over the greater trochanter. Not so much of the joint. Celebrex marginally beneficial. She has been consistent with conditioning, but still has pain with walking particularly on hills. They have a trip all over the [place], [place], [place], [place] in September, I am happy to try an ultrasound-guided trochanteric bursa injection with medication placed at the gluteus medius and minimus in July, and then again right before the trip if they would like. If all of this fails we would consider updated MRI and PRP.

## 2022-06-04 NOTE — Assessment & Plan Note (Signed)
Cervical spondylitic processes, much better with prednisone, home conditioning. Return to see me as needed.

## 2022-06-04 NOTE — Progress Notes (Signed)
    Procedures performed today:    None.  Independent interpretation of notes and tests performed by another provider:   None.  Brief History, Exam, Impression, and Recommendations:    Left hip pain [name], we talked about her hip pain today, she has years of pain left hip, historically diagnosis of greater trochanteric bursitis, she had physical therapy, some steroid injections, Tenex, none of which seem to work. Clinically she does have pain directly over the greater trochanter. Not so much of the joint. Celebrex marginally beneficial. She has been consistent with conditioning, but still has pain with walking particularly on hills. They have a trip all over the [place], [place], [place], [place] in September, I am happy to try an ultrasound-guided trochanteric bursa injection with medication placed at the gluteus medius and minimus in July, and then again right before the trip if they would like. If all of this fails we would consider updated MRI and PRP.  Cervical spondylosis Cervical spondylitic processes, much better with prednisone, home conditioning. Return to see me as needed.    ___________________________________________ Gwen Her. Dianah Field, M.D., ABFM., CAQSM. Primary Care and [place] Instructor of [place] of [facility] of Medicine

## 2022-06-25 ENCOUNTER — Ambulatory Visit (INDEPENDENT_AMBULATORY_CARE_PROVIDER_SITE_OTHER): Payer: Medicare Other | Admitting: Sports Medicine

## 2022-06-25 ENCOUNTER — Encounter (HOSPITAL_BASED_OUTPATIENT_CLINIC_OR_DEPARTMENT_OTHER): Payer: Self-pay

## 2022-06-25 ENCOUNTER — Ambulatory Visit (INDEPENDENT_AMBULATORY_CARE_PROVIDER_SITE_OTHER): Payer: Medicare Other

## 2022-06-25 ENCOUNTER — Ambulatory Visit (HOSPITAL_BASED_OUTPATIENT_CLINIC_OR_DEPARTMENT_OTHER)
Admission: RE | Admit: 2022-06-25 | Discharge: 2022-06-25 | Disposition: A | Discharge status: Home / Self Care | Payer: Medicare Other | Source: Ambulatory Visit | Attending: Oncology | Admitting: Oncology

## 2022-06-25 DIAGNOSIS — M25552 Pain in left hip: Secondary | ICD-10-CM

## 2022-06-25 DIAGNOSIS — Z9289 Personal history of other medical treatment: Secondary | ICD-10-CM | POA: Diagnosis not present

## 2022-06-25 DIAGNOSIS — Z1231 Encounter for screening mammogram for malignant neoplasm of breast: Secondary | ICD-10-CM | POA: Diagnosis present

## 2022-06-25 NOTE — Assessment & Plan Note (Signed)
This is a very pleasant 72 year old female, she has had years of left hip pain, historically with a diagnosis of greater trochanteric bursitis, she has had physical therapy, steroid injections, Tenex, none of which has given her long-term relief. Celebrex marginally beneficial. They have a trip all over the [place], [place], [place], [place] coming up in September and we will go ahead and try an injection around the gluteus medius and minimus as well as the greater trochanter. If this fails we will consider MRI and PRP.

## 2022-06-25 NOTE — Progress Notes (Signed)
    Procedures performed today:    Procedure: Real-time Ultrasound Guided injection of the left greater trochanteric bursa Device: Samsung HS60  Verbal informed consent obtained.  Time-out conducted.  Noted no overlying erythema, induration, or other signs of local infection.  Skin prepped in a sterile fashion.  Local anesthesia: Topical Ethyl chloride.  With sterile technique and under real time ultrasound guidance: Noted normal-appearing bursa, 1 cc Kenalog 40, 2 cc lidocaine, 2 cc bupivacaine injected easily Completed without difficulty  Advised to call if fevers/chills, erythema, induration, drainage, or persistent bleeding.  Images permanently stored and available for review in PACS.  Impression: Technically successful ultrasound guided injection.  Independent interpretation of notes and tests performed by another provider:   None.  Brief History, Exam, Impression, and Recommendations:    Left hip pain This is a very pleasant 72 year old female, she has had years of left hip pain, historically with a diagnosis of greater trochanteric bursitis, she has had physical therapy, steroid injections, Tenex, none of which has given her long-term relief. Celebrex marginally beneficial. They have a trip all over the [place], [place], [place] coming up in September and we will go ahead and try an injection around the gluteus medius and minimus as well as the greater trochanter. If this fails we will consider MRI and PRP.  Chronic process with exacerbation and pharmacologic intervention  ___________________________________________ Ihor Austin. Benjamin Stain, M.D., ABFM., CAQSM. Primary Care and Sports Medicine [place] [facility]  Adjunct Instructor of Family Medicine  [employer] [facility] [employer] of [employer]

## 2022-07-19 ENCOUNTER — Ambulatory Visit: Payer: Medicare Other | Admitting: Sports Medicine

## 2022-07-26 ENCOUNTER — Ambulatory Visit (INDEPENDENT_AMBULATORY_CARE_PROVIDER_SITE_OTHER): Payer: Medicare Other | Admitting: Sports Medicine

## 2022-07-26 DIAGNOSIS — M25552 Pain in left hip: Secondary | ICD-10-CM | POA: Diagnosis not present

## 2022-07-26 DIAGNOSIS — Z96642 Presence of left artificial hip joint: Secondary | ICD-10-CM

## 2022-07-26 NOTE — Assessment & Plan Note (Signed)
Very pleasant 72 year old female, years of left hip pain, historically has a diagnosis of greater trochanteric bursitis, she had physical therapy, steroid injections, Tenex, none of which have given her long-term relief, only marginally beneficial Celebrex. They have a trip all over [place], [place], [place], [place] coming up in September. At the last visit we did a greater trochanteric bursa injection, we also tagged to the gluteus medius and minimus. Did not have much relief. This raises the question of whether we are treating the right thing, proceeding with MRI due to failure of conservative treatment, she also has some pain in the back, so we will MRI the hip and lumbar spine. Return to go over results. I have okayed an injection right before their trip if need be. 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

## 2022-07-26 NOTE — Progress Notes (Signed)
    Procedures performed today:    None.  Independent interpretation of notes and tests performed by another provider:   None.  Brief History, Exam, Impression, and Recommendations:    Left hip pain Very pleasant 72 year old female, years of left hip pain, historically has a diagnosis of greater trochanteric bursitis, she had physical therapy, steroid injections, Tenex, none of which have given her long-term relief, only marginally beneficial Celebrex. They have a trip all over [place], [place], [place], [place] coming up in September. At the last visit we did a greater trochanteric bursa injection, we also tagged to the gluteus medius and minimus. Did not have much relief. This raises the question of whether we are treating the right thing, proceeding with MRI due to failure of conservative treatment, she also has some pain in the back, so we will MRI the hip and lumbar spine. Return to go over results. I have okayed an injection right before their trip if need be. 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    ____________________________________________ Gwen Her. Dianah Field, M.D., ABFM., CAQSM., AME. Primary Care and Sports Medicine [place] [facility]  Adjunct Professor of [place] of [facility] of Medicine  Risk manager

## 2022-07-28 ENCOUNTER — Other Ambulatory Visit: Payer: Medicare Other

## 2022-07-28 ENCOUNTER — Ambulatory Visit (INDEPENDENT_AMBULATORY_CARE_PROVIDER_SITE_OTHER): Payer: Medicare Other

## 2022-07-28 DIAGNOSIS — Z96642 Presence of left artificial hip joint: Secondary | ICD-10-CM

## 2022-07-28 DIAGNOSIS — M545 Low back pain, unspecified: Secondary | ICD-10-CM | POA: Diagnosis not present

## 2022-07-28 DIAGNOSIS — M25552 Pain in left hip: Secondary | ICD-10-CM

## 2022-08-02 ENCOUNTER — Encounter (INDEPENDENT_AMBULATORY_CARE_PROVIDER_SITE_OTHER): Payer: Medicare Other | Admitting: Sports Medicine

## 2022-08-02 DIAGNOSIS — M25552 Pain in left hip: Secondary | ICD-10-CM

## 2022-08-02 NOTE — Telephone Encounter (Signed)

## 2022-08-22 ENCOUNTER — Encounter: Payer: Self-pay | Admitting: General Practice

## 2022-08-31 ENCOUNTER — Ambulatory Visit (INDEPENDENT_AMBULATORY_CARE_PROVIDER_SITE_OTHER): Payer: Medicare Other | Admitting: Family Medicine

## 2022-08-31 DIAGNOSIS — Z0000 Encounter for general adult medical examination without abnormal findings: Secondary | ICD-10-CM

## 2022-08-31 NOTE — Patient Instructions (Addendum)
[facility] Maintenance Summary and Written Plan of Care  Amanda Dickson ,  Thank you for allowing me to perform your Medicare Annual Wellness Visit and for your ongoing commitment to your health.   Health Maintenance & Immunization History Health Maintenance  Topic Date Due  . COVID-19 Vaccine (2 - Pfizer risk series) 08/31/2022 (Originally 12/06/2021)  . INFLUENZA VACCINE  03/31/2023 (Originally 07/31/2022)  . Pneumonia Vaccine 6+ Years old (1 - PCV) 05/25/2023 (Originally 11/24/2013)  . Hepatitis C Screening  05/25/2023 (Originally 11/24/1968)  . DEXA SCAN  10/24/2023 (Originally 11/24/2013)  . MAMMOGRAM  06/25/2024  . COLONOSCOPY (Pts 45-24yr Insurance coverage will need to be confirmed)  09/22/2024  . TETANUS/TDAP  08/01/2032  . Zoster Vaccines- Shingrix  Completed  . HPV VACCINES  Aged Out   Immunization History  Administered Date(s) Administered  . Fluad Quad(high Dose 65+) 10/23/2021  . PPension scheme manager179yr& up 11/15/2021  . Pneumococcal-Unspecified 02/28/2021  . Tdap 08/01/2022  . Zoster Recombinat (Shingrix) 10/25/2021, 01/08/2022    These are the patient goals that we discussed:  Goals Addressed              This Visit's Progress   .  Patient Stated (pt-stated)        08/31/2022 AWV Goal: Exercise for General Health  Patient will verbalize understanding of the benefits of increased physical activity: Exercising regularly is important. It will improve your overall fitness, flexibility, and endurance. Regular exercise also will improve your overall health. It can help you control your weight, reduce stress, and improve your bone density. Over the next year, patient will increase physical activity as tolerated with a goal of at least 150 minutes of moderate physical activity per week.  You can tell that you are exercising at a moderate intensity if your heart starts beating faster and you start breathing faster  but can still hold a conversation. Moderate-intensity exercise ideas include: Walking 1 mile (1.6 km) in about 15 minutes Biking Hiking Golfing Dancing Water aerobics Patient will verbalize understanding of everyday activities that increase physical activity by providing examples like the following: Yard work, such as: PuSales promotion account executiveardening Washing windows or floors Patient will be able to explain general safety guidelines for exercising:  Before you start a new exercise program, talk with your health care provider. Do not exercise so much that you hurt yourself, feel dizzy, or get very short of breath. Wear comfortable clothes and wear shoes with good support. Drink plenty of water while you exercise to prevent dehydration or heat stroke. Work out until your breathing and your heartbeat get faster.         This is a list of Health Maintenance Items that are overdue or due now: Pneumococcal vaccine  Influenza vaccine Bone densitometry screening  Orders/Referrals Placed Today: No orders of the defined types were placed in this encounter.  (Contact our referral department at [phone] you have not spoken with someone about your referral appointment within the next 5 days)    Follow-up Plan Follow-up with MaLuetta NuttingDO as planned Discuss bone density and pneumonia vaccine.  Medicare wellness visit in one year. Patient will access AVS on my chart.      Health Maintenance, Female Adopting a healthy lifestyle and getting preventive care are important in promoting health and wellness. Ask your health care provider about: The right schedule for  you to have regular tests and exams. Things you can do on your own to prevent diseases and keep yourself healthy. What should I know about diet, weight, and exercise? Eat a healthy diet  Eat a diet that includes plenty of vegetables,  fruits, low-fat dairy products, and lean protein. Do not eat a lot of foods that are high in solid fats, added sugars, or sodium. Maintain a healthy weight Body mass index (BMI) is used to identify weight problems. It estimates body fat based on height and weight. Your health care provider can help determine your BMI and help you achieve or maintain a healthy weight. Get regular exercise Get regular exercise. This is one of the most important things you can do for your health. Most adults should: Exercise for at least 150 minutes each week. The exercise should increase your heart rate and make you sweat (moderate-intensity exercise). Do strengthening exercises at least twice a week. This is in addition to the moderate-intensity exercise. Spend less time sitting. Even light physical activity can be beneficial. Watch cholesterol and blood lipids Have your blood tested for lipids and cholesterol at 74 years of age, then have this test every 5 years. Have your cholesterol levels checked more often if: Your lipid or cholesterol levels are high. You are older than 74 years of age. You are at high risk for heart disease. What should I know about cancer screening? Depending on your health history and family history, you may need to have cancer screening at various ages. This may include screening for: Breast cancer. Cervical cancer. Colorectal cancer. Skin cancer. Lung cancer. What should I know about heart disease, diabetes, and high blood pressure? Blood pressure and heart disease High blood pressure causes heart disease and increases the risk of stroke. This is more likely to develop in people who have high blood pressure readings or are overweight. Have your blood pressure checked: Every 3-5 years if you are 42-24 years of age. Every year if you are 57 years old or older. Diabetes Have regular diabetes screenings. This checks your fasting blood sugar level. Have the screening done: Once  every three years after age 77 if you are at a normal weight and have a low risk for diabetes. More often and at a younger age if you are overweight or have a high risk for diabetes. What should I know about preventing infection? Hepatitis B If you have a higher risk for hepatitis B, you should be screened for this virus. Talk with your health care provider to find out if you are at risk for hepatitis B infection. Hepatitis C Testing is recommended for: Everyone born from [birth year] through 1967. Anyone with known risk factors for hepatitis C. Sexually transmitted infections (STIs) Get screened for STIs, including gonorrhea and chlamydia, if: You are sexually active and are younger than 74 years of age. You are older than 74 years of age and your health care provider tells you that you are at risk for this type of infection. Your sexual activity has changed since you were last screened, and you are at increased risk for chlamydia or gonorrhea. Ask your health care provider if you are at risk. Ask your health care provider about whether you are at high risk for HIV. Your health care provider may recommend a prescription medicine to help prevent HIV infection. If you choose to take medicine to prevent HIV, you should first get tested for HIV. You should then be tested every 3 months for as  long as you are taking the medicine. Pregnancy If you are about to stop having your period (premenopausal) and you may become pregnant, seek counseling before you get pregnant. Take 400 to 800 micrograms (mcg) of folic acid every day if you become pregnant. Ask for birth control (contraception) if you want to prevent pregnancy. Osteoporosis and menopause Osteoporosis is a disease in which the bones lose minerals and strength with aging. This can result in bone fractures. If you are 47 years old or older, or if you are at risk for osteoporosis and fractures, ask your health care provider if you should: Be screened for  bone loss. Take a calcium or vitamin D supplement to lower your risk of fractures. Be given hormone replacement therapy (HRT) to treat symptoms of menopause. Follow these instructions at home: Alcohol use Do not drink alcohol if: Your health care provider tells you not to drink. You are pregnant, may be pregnant, or are planning to become pregnant. If you drink alcohol: Limit how much you have to: 0-1 drink a day. Know how much alcohol is in your drink. In the [place] one drink equals one 12 oz bottle of beer (355 mL), one 5 oz glass of wine (148 mL), or one 1 oz glass of hard liquor (44 mL). Lifestyle Do not use any products that contain nicotine or tobacco. These products include cigarettes, chewing tobacco, and vaping devices, such as e-cigarettes. If you need help quitting, ask your health care provider. Do not use street drugs. Do not share needles. Ask your health care provider for help if you need support or information about quitting drugs. General instructions Schedule regular health, dental, and eye exams. Stay current with your vaccines. Tell your health care provider if: You often feel depressed. You have ever been abused or do not feel safe at home. Summary Adopting a healthy lifestyle and getting preventive care are important in promoting health and wellness. Follow your health care provider's instructions about healthy diet, exercising, and getting tested or screened for diseases. Follow your health care provider's instructions on monitoring your cholesterol and blood pressure. This information is not intended to replace advice given to you by your health care provider. Make sure you discuss any questions you have with your health care provider. Document Revised: 05/08/2021 Document Reviewed: 05/08/2021 Elsevier Patient Education  [place].

## 2022-08-31 NOTE — Progress Notes (Signed)
MEDICARE ANNUAL WELLNESS VISIT  08/31/2022  Telephone Visit Disclaimer This Medicare AWV was conducted by telephone due to national recommendations for restrictions regarding the COVID-19 Pandemic (e.g. social distancing).  I verified, using two identifiers, that I am speaking with Amanda Dickson or their authorized healthcare agent. I discussed the limitations, risks, security, and privacy concerns of performing an evaluation and management service by telephone and the potential availability of an in-person appointment in the future. The patient expressed understanding and agreed to proceed.  Location of Patient: Home Location of Provider (nurse):  In the office.  Subjective:    Amanda Dickson is a 72 y.o. female patient of Luetta Nutting, DO who had a Medicare Annual Wellness Visit today via telephone. Kanyah is Retired and lives with their spouse. she has 2 children. she reports that she is socially active and does interact with friends/family regularly. she is moderately physically active and enjoys sewing, crocheting and travelling.  Patient Care Team: Luetta Nutting, DO as PCP - General (Family Medicine)     08/31/2022    9:54 AM 10/23/2021    9:38 AM  Advanced Directives  Does Patient Have a Medical Advance Directive? Yes Yes  Type of Advance Directive Living will [place];Living will;Out of facility DNR (pink MOST or yellow form)  Does patient want to make changes to medical advance directive? No - Patient declined No - Patient declined  Copy of [place] in Chart?  No - copy requested    Hospital Utilization Over the Past 12 Months: # of hospitalizations or ER visits: 0 # of surgeries: 0  Review of Systems    Patient reports that her overall health is unchanged compared to last year.  History obtained from chart review and the patient  Patient Reported Readings (BP, Pulse, CBG, Weight, etc) none  Pain Assessment Pain :  No/denies pain     Current Medications & Allergies (verified) Allergies as of 08/31/2022       Reactions   Meperidine Hcl    Paralysis feelings        Medication List        Accurate as of August 31, 2022 10:03 AM. If you have any questions, ask your nurse or doctor.          celecoxib 200 MG capsule Commonly known as: CELEBREX Take 1 capsule (200 mg total) by mouth 2 (two) times daily as needed.   cholecalciferol 25 MCG (1000 UNIT) tablet Commonly known as: VITAMIN D3 Take 1,000 Units by mouth daily.   Fiber Adult Gummies 2 g Chew Chew 2 each by mouth daily.   levothyroxine 50 MCG tablet Commonly known as: SYNTHROID TAKE 1  TAB BY MOUTH DAILY   Multi-Vitamin Gummies Chew Chew 2 tablets by mouth daily.   PROBIOTIC DAILY PO Take by mouth. Take one capsule daily   Vitamin B-12 5000 MCG Subl        History (reviewed): Past Medical History:  Diagnosis Date   Allergy    Arthritis Long ago!!   Breast cancer [facility]) 2013   Right, s/p lumpectomy with brachytherapy   Colon cancer ([place]) 2021   Hypothyroidism    Past Surgical History:  Procedure Laterality Date   APPENDECTOMY  2021   part of colon removal   BREAST SURGERY  2013   Rt. Lumpectomy   CHOLECYSTECTOMY     COLON SURGERY  09-12-2020   COLONOSCOPY  09/06/2020   Dr. Larkin [facility]. Moderate diverticulosis of the sigmoid colon, Mass (3cm) in the proximal ascending colon.   COLONOSCOPY  10/16/2010   Dr. [facility]in [facility]. Moderate diverticulosis sigmoid colon, grade 1 internal hemorrhoids   HERNIA REPAIR     TONSILLECTOMY     TUBAL LIGATION     Family History  Problem Relation Age of Onset   Melanoma Mother    Arthritis Mother    Cancer Mother    Vision loss Mother    Colon cancer Father    Prostate cancer Father    Liver cancer Father    Cancer Father    Colon cancer Maternal Grandfather    Cancer Maternal Grandfather    Crohn's disease  Maternal Aunt        Ulcerative colitis   Cancer Brother    Rectal cancer Neg Hx    Esophageal cancer Neg Hx    Pancreatic cancer Neg Hx    Stomach cancer Neg Hx    Social History   Socioeconomic History   Marital status: Married    Spouse name: Ilina Xu   Number of children: 2   Years of education: 16   Highest education level: Bachelor's degree (e.g., BA, AB, BS)  Occupational History   Occupation: Retired   Occupation: Retired  Tobacco Use   Smoking status: Never    Passive exposure: Never   Smokeless tobacco: Never  Vaping Use   Vaping Use: Never used  Substance and Sexual Activity   Alcohol use: Never   Drug use: Never   Sexual activity: Yes    Partners: Male    Birth control/protection: Post-menopausal  Other Topics Concern   Not on file  Social History Narrative   Lives with her husband. She enjoys sewing, crocheting and travelling.   Social Determinants of Health   Financial Resource Strain: Low Risk  (08/29/2022)   Overall Financial Resource Strain (CARDIA)    Difficulty of Paying Living Expenses: Not hard at all  Food Insecurity: No Food Insecurity (08/29/2022)   Hunger Vital Sign    Worried About Running Out of Food in the Last Year: Never true    Ran Out of Food in the Last Year: Never true  Transportation Needs: No Transportation Needs (08/29/2022)   PRAPARE - Hydrologist (Medical): No    Lack of Transportation (Non-Medical): No  Physical Activity: Sufficiently Active (08/29/2022)   Exercise Vital Sign    Days of Exercise per Week: 5 days    Minutes of Exercise per Session: 30 min  Stress: No Stress Concern Present (08/29/2022)   [place]    Feeling of Stress : Not at all  Social Connections: [employer] (08/31/2022)   Social Connection and Isolation Panel [NHANES]    Frequency of Communication with Friends and Family: More than three times a  week    Frequency of Social Gatherings with Friends and Family: Once a week    Attends Religious Services: More than 4 times per year    Active Member of [employer] or Organizations: Yes    Attends Archivist Meetings: More than 4 times per year    Marital Status: Married    Activities of Daily Living    08/31/2022    9:54 AM 08/29/2022    3:23 PM  In your present state of health, do you have any difficulty performing the following activities:  Hearing?  0  Vision?  0  Difficulty concentrating or making decisions? 1 1  Comment some difficulty remembering. some difficulty remembering  Walking or climbing stairs?  0  Dressing or bathing?  0  Doing errands, shopping?  0  Preparing Food and eating ?  N  Using the Toilet?  N  In the past six months, have you accidently leaked urine?  N  Do you have problems with loss of bowel control?  N  Managing your Medications?  N  Managing your Finances?  N  Housekeeping or managing your Housekeeping?  N    Patient Education/ Literacy How often do you need to have someone help you when you read instructions, pamphlets, or other written materials from your doctor or pharmacy?: 1 - Never What is the last grade level you completed in school?: Bachelor's degree  Exercise Current Exercise Habits: Home exercise routine, Type of exercise: stretching;treadmill, Time (Minutes): 30, Frequency (Times/Week): 6, Weekly Exercise (Minutes/Week): 180, Intensity: Moderate, Exercise limited by: orthopedic condition(s)  Diet Patient reports consuming 2 meals a day and 1 snack(s) a day Patient reports that her primary diet is: Regular Patient reports that she does have regular access to food.   Depression Screen    08/31/2022    9:57 AM 10/23/2021    9:39 AM  PHQ 2/9 Scores  PHQ - 2 Score 0 0  PHQ- 9 Score  2     Fall Risk    08/31/2022    9:57 AM 08/29/2022    3:23 PM 10/23/2021    9:38 AM  Fall Risk   Falls in the past year? 0 0 1  Number falls  in past yr: 0 0 0  Injury with Fall? 0 0 0  Risk for fall due to : No Fall Risks  No Fall Risks  Follow up Falls evaluation completed  Falls evaluation completed     Objective:  Giliana Vantil seemed alert and oriented and she participated appropriately during our telephone visit.  Blood Pressure Weight BMI  BP Readings from Last 3 Encounters:  05/24/22 (!) 157/76  10/23/21 (!) 146/78  09/22/21 137/84   Wt Readings from Last 3 Encounters:  05/24/22 188 lb (85.3 kg)  10/23/21 172 lb 1.6 oz (78.1 kg)  09/22/21 171 lb (77.6 kg)   BMI Readings from Last 1 Encounters:  05/24/22 31.13 kg/m    *Unable to obtain current vital signs, weight, and BMI due to telephone visit type  Hearing/Vision  Keina did not seem to have difficulty with hearing/understanding during the telephone conversation Reports that she has not had a formal eye exam by an eye care professional within the past year Reports that she has not had a formal hearing evaluation within the past year *Unable to fully assess hearing and vision during telephone visit type  Cognitive Function:    08/31/2022    9:58 AM  6CIT Screen  What Year? 0 points  What month? 0 points  What time? 0 points  Count back from 20 0 points  Months in reverse 0 points  Repeat phrase 2 points  Total Score 2 points   (Normal:0-7, Significant for Dysfunction: >8)  Normal Cognitive Function Screening: Yes   Immunization & Health Maintenance Record Immunization History  Administered Date(s) Administered   Fluad Quad(high Dose 65+) 10/23/2021   Pfizer Covid-19 Vaccine Bivalent Booster 67yr & up 11/15/2021   Pneumococcal-Unspecified 02/28/2021   Tdap 08/01/2022   Zoster Recombinat (Shingrix) 10/25/2021, 01/08/2022    Health Maintenance  Topic Date Due  COVID-19 Vaccine (2 - Pfizer risk series) 08/31/2022 (Originally 12/06/2021)   INFLUENZA VACCINE  03/31/2023 (Originally 07/31/2022)   Pneumonia Vaccine 57+ Years old (1 - PCV)  05/25/2023 (Originally 11/24/2013)   Hepatitis C Screening  05/25/2023 (Originally 11/24/1968)   DEXA SCAN  10/24/2023 (Originally 11/24/2013)   MAMMOGRAM  06/25/2024   COLONOSCOPY (Pts 45-65yr Insurance coverage will need to be confirmed)  09/22/2024   TETANUS/TDAP  08/01/2032   Zoster Vaccines- Shingrix  Completed   HPV VACCINES  Aged Out       Assessment  This is a routine wellness examination for [employer]  Health Maintenance: Due or Overdue There are no preventive care reminders to display for this patient.   DMiquel Dunndoes not need a referral for Community Assistance: Care Management:   no Social Work:    no Prescription Assistance:  no Nutrition/Diabetes Education:  no   Plan:  Personalized Goals  Goals Addressed               This Visit's Progress     Patient Stated (pt-stated)        08/31/2022 AWV Goal: Exercise for General Health  Patient will verbalize understanding of the benefits of increased physical activity: Exercising regularly is important. It will improve your overall fitness, flexibility, and endurance. Regular exercise also will improve your overall health. It can help you control your weight, reduce stress, and improve your bone density. Over the next year, patient will increase physical activity as tolerated with a goal of at least 150 minutes of moderate physical activity per week.  You can tell that you are exercising at a moderate intensity if your heart starts beating faster and you start breathing faster but can still hold a conversation. Moderate-intensity exercise ideas include: Walking 1 mile (1.6 km) in about 15 minutes Biking Hiking Golfing Dancing Water aerobics Patient will verbalize understanding of everyday activities that increase physical activity by providing examples like the following: Yard work, such as: PLocation managerGardening Washing windows or floors Patient will be able to explain general safety guidelines for exercising:  Before you start a new exercise program, talk with your health care provider. Do not exercise so much that you hurt yourself, feel dizzy, or get very short of breath. Wear comfortable clothes and wear shoes with good support. Drink plenty of water while you exercise to prevent dehydration or heat stroke. Work out until your breathing and your heartbeat get faster.        Personalized Health Maintenance & Screening Recommendations  Pneumococcal vaccine  Influenza vaccine Bone densitometry screening  Lung Cancer Screening Recommended: no (Low Dose CT Chest recommended if Age 74-80years, 30 pack-year currently smoking OR have quit w/in past 15 years) Hepatitis C Screening recommended: no HIV Screening recommended: no  Advanced Directives: Written information was not prepared per patient's request.  Referrals & Orders No orders of the defined types were placed in this encounter.   Follow-up Plan Follow-up with MLuetta Nutting DO as planned Discuss bone density and pneumonia vaccine.  Medicare wellness visit in one year. Patient will access AVS on my chart.   I have personally reviewed and noted the following in the patient's chart:   Medical and social history Use of alcohol, tobacco or illicit drugs  Current medications and supplements Functional ability and status Nutritional status Physical activity Advanced directives List of other physicians Hospitalizations, surgeries, and ER  visits in previous 12 months Vitals Screenings to include cognitive, depression, and falls Referrals and appointments  In addition, I have reviewed and discussed with Amanda Dickson certain preventive protocols, quality metrics, and best practice recommendations. A written personalized care plan for preventive services as well as general preventive health recommendations is  available and can be mailed to the patient at her request.      Tinnie Gens, RN BSN   08/31/2022

## 2022-09-05 ENCOUNTER — Other Ambulatory Visit: Payer: Self-pay | Admitting: Family Medicine

## 2022-09-18 ENCOUNTER — Inpatient Hospital Stay: Payer: Medicare Other | Admitting: Oncology

## 2022-09-21 ENCOUNTER — Ambulatory Visit (INDEPENDENT_AMBULATORY_CARE_PROVIDER_SITE_OTHER): Payer: Medicare Other | Admitting: Sports Medicine

## 2022-09-21 DIAGNOSIS — M25552 Pain in left hip: Secondary | ICD-10-CM | POA: Diagnosis not present

## 2022-09-21 NOTE — Assessment & Plan Note (Signed)
Pleasant 72 year old female, chronic left hip pain with a historic diagnosis of greater trochanteric bursitis, she has had multiple modalities including physical therapy, steroid injections, Tenex, nothing gave long-term relief, only marginal benefit with Celebrex. On Monday to leave further trip to the [place], [place], [place], [place] We did a trochanteric bursa injection back in June, she did not have much relief. We added aggressive hip abductor conditioning, she has been diligent with this and is now starting to report good relief of her symptoms, she can take tramadol as needed during the trip and return to see me as needed. Of note we did get lumbar spine imaging as well that showed multilevel neuroforaminal stenosis, these can be additional targets for injection in the future if needed. PRP also remains an option if we continue to suspect her pain is coming from her hip abductor's and trochanteric bursa.

## 2022-09-21 NOTE — Progress Notes (Signed)
    Procedures performed today:    None.  Independent interpretation of notes and tests performed by another provider:   None.  Brief History, Exam, Impression, and Recommendations:    Left hip pain Pleasant 72 year old female, chronic left hip pain with a historic diagnosis of greater trochanteric bursitis, she has had multiple modalities including physical therapy, steroid injections, Tenex, nothing gave long-term relief, only marginal benefit with Celebrex. On Monday to leave further trip to the [place], [place], [place], [place] We did a trochanteric bursa injection back in June, she did not have much relief. We added aggressive hip abductor conditioning, she has been diligent with this and is now starting to report good relief of her symptoms, she can take tramadol as needed during the trip and return to see me as needed. Of note we did get lumbar spine imaging as well that showed multilevel neuroforaminal stenosis, these can be additional targets for injection in the future if needed. PRP also remains an option if we continue to suspect her pain is coming from her hip abductor's and trochanteric bursa.    ____________________________________________ Gwen Her. Dianah Field, M.D., ABFM., CAQSM., AME. Primary Care and Sports Medicine  [facility]  Adjunct Professor of [place] [employer] [facility] [employer] [facility] [employer] [facility] of Medicine  Risk manager

## 2022-10-25 IMAGING — DX DG LUMBAR SPINE COMPLETE 4+V
5 series · 5 of 5 positions shown · non-contrast
Comparison: None.

CLINICAL DATA: Chronic right low back pain.

EXAM:
LUMBAR SPINE - COMPLETE 4+ VIEW

[l-spine ap]
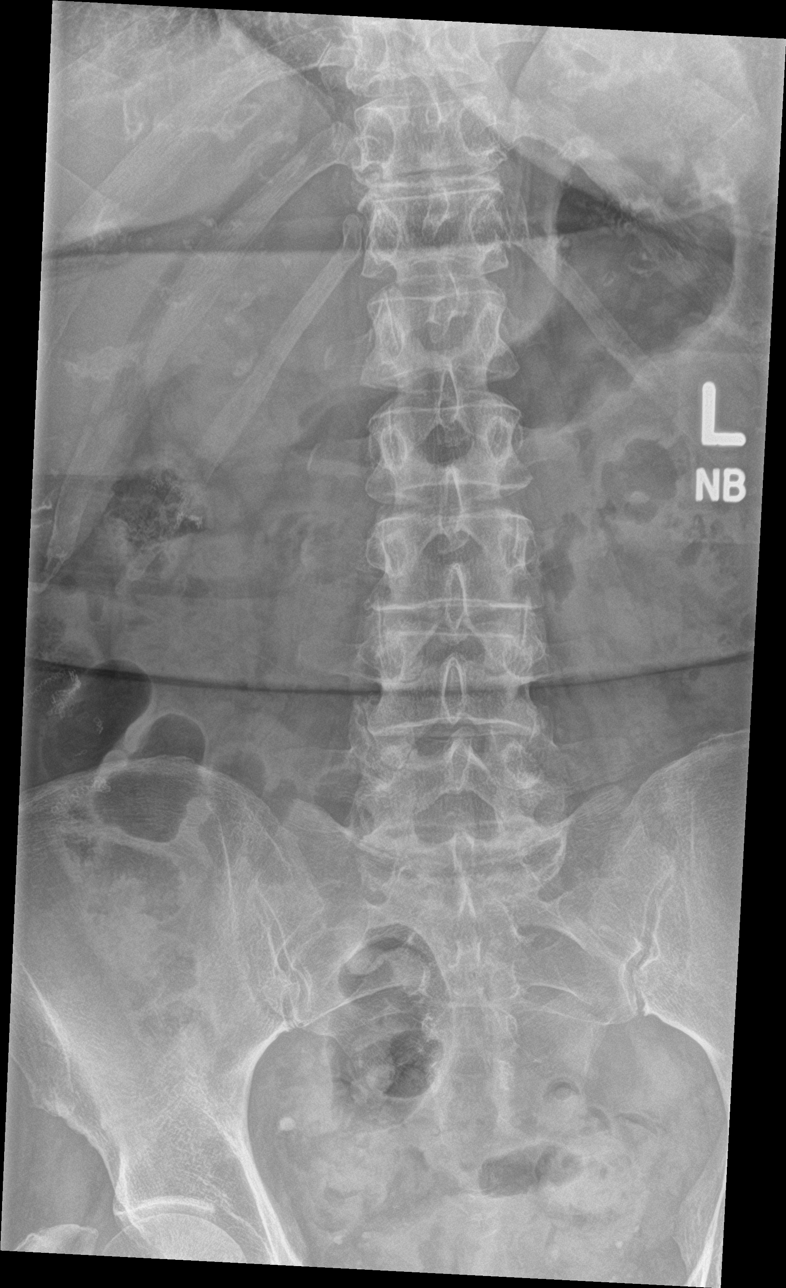

[l-spine obl (1 of 2)]
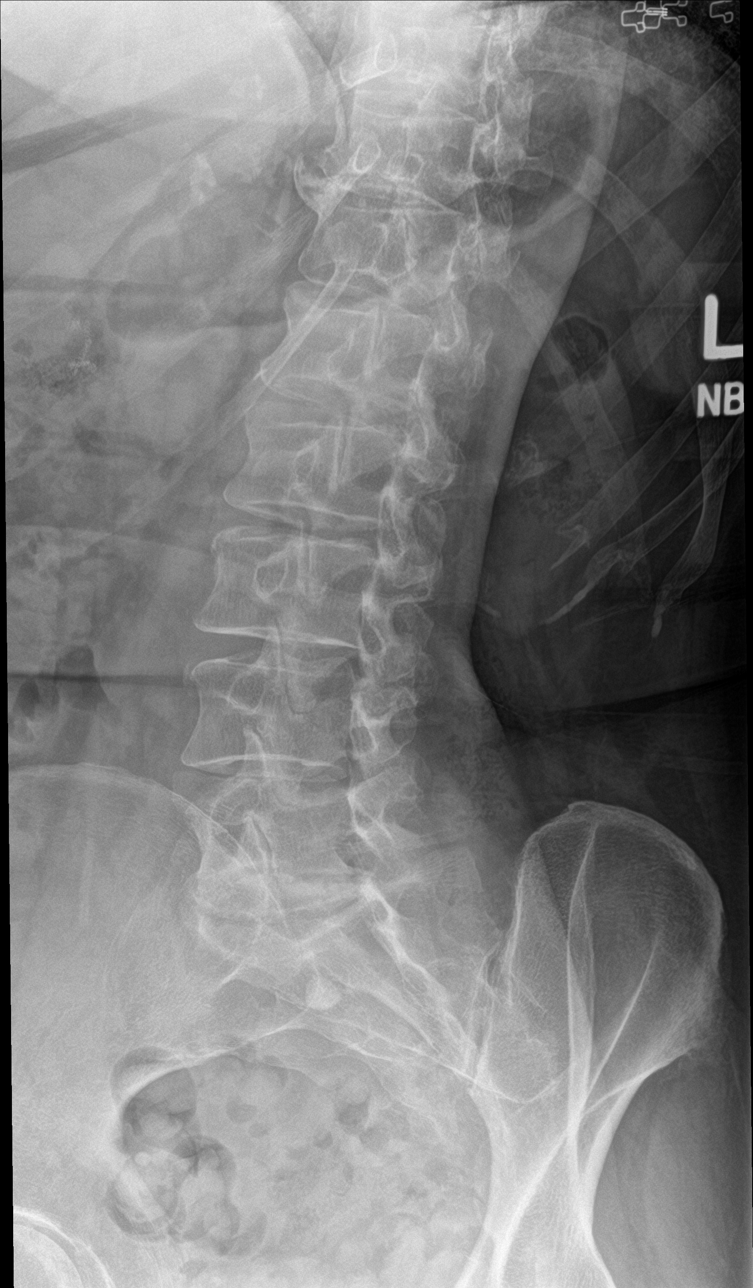

[l-spine obl (2 of 2)]
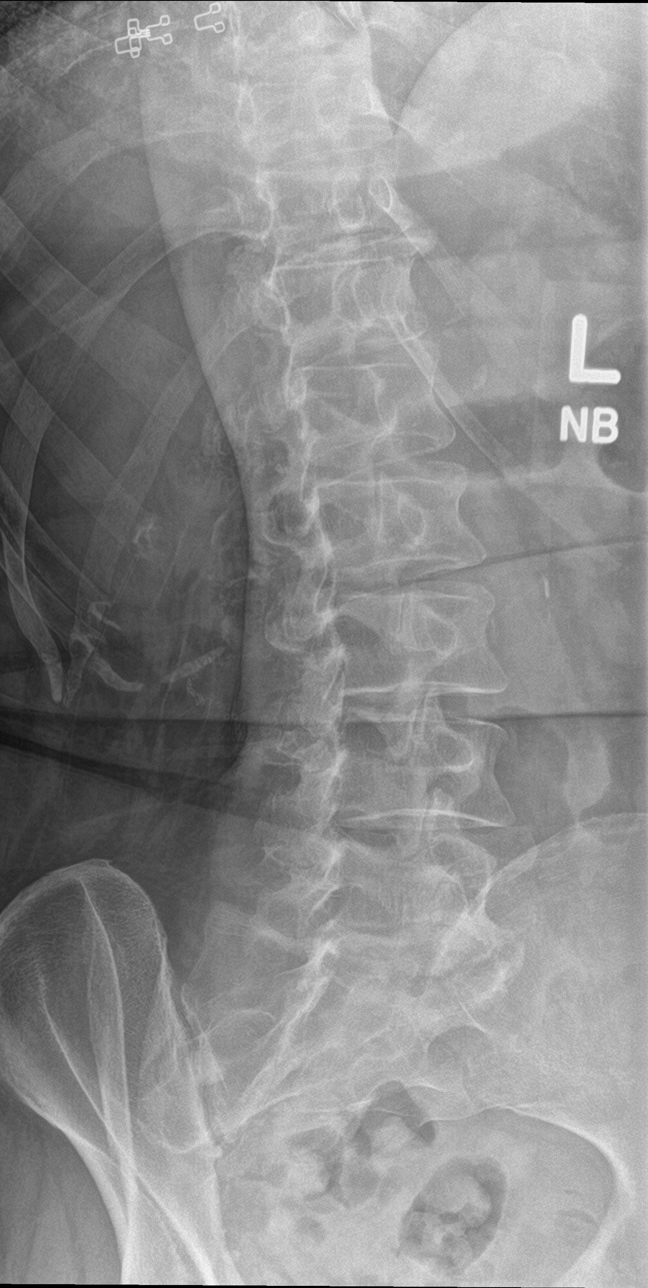

[l-spine lat]
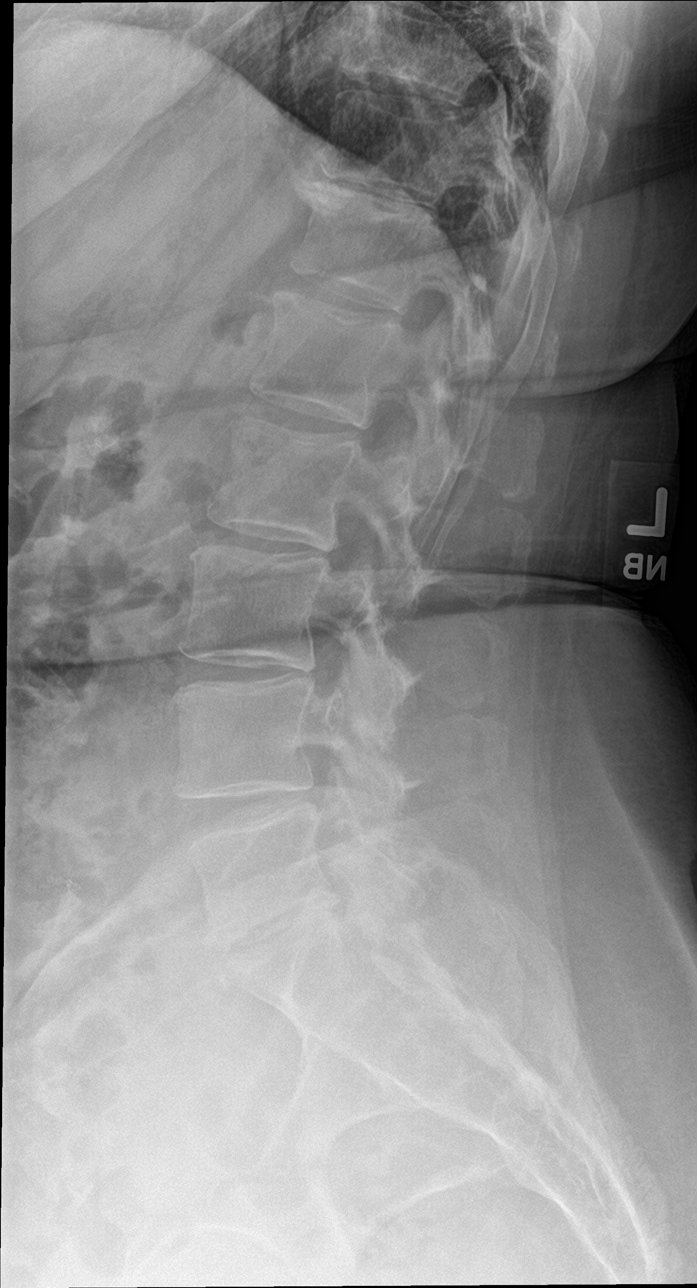

[l-spine spot]
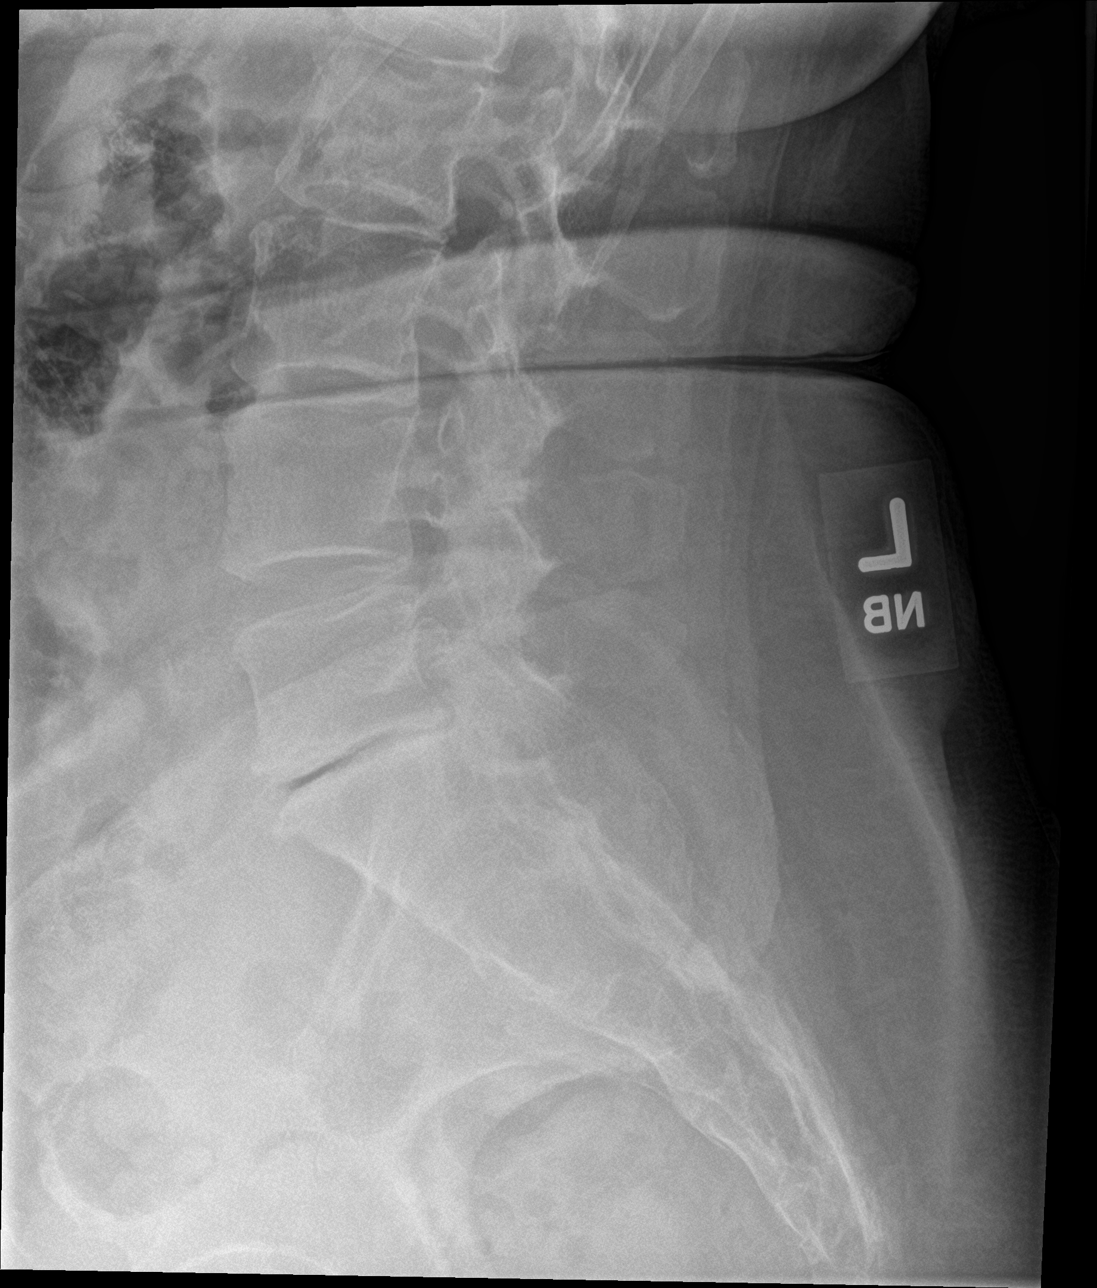

[5 of 5 positions shown; findings below may reference images not displayed]

FINDINGS: There is no evidence for acute fracture. Alignment is anatomic.
There is moderate severe disc space narrowing at L5-S1 with vacuum
disc phenomena and osteophyte formation compatible with degenerative
change. Otherwise, there is mild degenerative disc space narrowing
throughout the lumbar spine. No focal osseous lesions are seen. Soft
tissues are within normal limits.
IMPRESSION: 1. No acute fracture or malalignment.
2. Degenerative changes throughout the lumbar spine most significant
at L5-S1.

## 2022-11-01 ENCOUNTER — Inpatient Hospital Stay: Payer: Medicare Other | Attending: Oncology | Admitting: Oncology

## 2022-11-01 ENCOUNTER — Other Ambulatory Visit (HOSPITAL_BASED_OUTPATIENT_CLINIC_OR_DEPARTMENT_OTHER): Payer: Self-pay

## 2022-11-01 VITALS — BP 162/72 | HR 66 | Temp 98.1°F | Resp 18 | Ht 65.16 in | Wt 190.6 lb

## 2022-11-01 DIAGNOSIS — E039 Hypothyroidism, unspecified: Secondary | ICD-10-CM | POA: Diagnosis not present

## 2022-11-01 DIAGNOSIS — Z08 Encounter for follow-up examination after completed treatment for malignant neoplasm: Secondary | ICD-10-CM | POA: Diagnosis not present

## 2022-11-01 DIAGNOSIS — Z1231 Encounter for screening mammogram for malignant neoplasm of breast: Secondary | ICD-10-CM

## 2022-11-01 DIAGNOSIS — Z853 Personal history of malignant neoplasm of breast: Secondary | ICD-10-CM | POA: Diagnosis present

## 2022-11-01 DIAGNOSIS — Z170 Estrogen receptor positive status [ER+]: Secondary | ICD-10-CM

## 2022-11-01 DIAGNOSIS — Z85038 Personal history of other malignant neoplasm of large intestine: Secondary | ICD-10-CM | POA: Insufficient documentation

## 2022-11-01 DIAGNOSIS — C50411 Malignant neoplasm of upper-outer quadrant of right female breast: Secondary | ICD-10-CM | POA: Diagnosis not present

## 2022-11-01 MED ORDER — COMIRNATY 30 MCG/0.3ML IM SUSY
PREFILLED_SYRINGE | INTRAMUSCULAR | 0 refills | Status: DC
Start: 2022-11-01 — End: 2022-11-28
  Filled 2022-11-01: qty 0.3, 1d supply, fill #0

## 2022-11-01 NOTE — Progress Notes (Addendum)
  [place] OFFICE PROGRESS NOTE   Diagnosis: Colon cancer, breast cancer  INTERVAL HISTORY:   Amanda Dickson returns as scheduled.  She feels well.  She just returned from a month-long trip to [place].  No change over either breast.  No difficulty with bowel function.  Mammogram 06/25/2022 was negative.  Objective:  Vital signs in last 24 hours:  Blood pressure (!) 162/72, pulse 66, temperature 98.1 F (36.7 C), temperature source Oral, resp. rate 18, height 5' 5.16" (1.655 m), weight 190 lb 9.6 oz (86.5 kg), SpO2 98 %.    HEENT: Neck without mass Lymphatics: No cervical, supraclavicular, axillary, or inguinal nodes Resp: Lungs clear bilaterally Cardio: Rate and rhythm GI: No hepatosplenomegaly Vascular: No leg edema Breast: Left breast without mass.  Status post right lumpectomy.  Firm tissue surrounding the lumpectomy scar.  There is a 1 cm oval superficial subcutaneous lesion at the 3 o'clock position of the right breast, medial aspect of the breast at the level of the nipple  Lab Results:  Lab Results  Component Value Date   WBC 4.8 10/23/2021   HGB 15.1 10/23/2021   HCT 44.7 10/23/2021   MCV 87.6 10/23/2021   PLT 227 10/23/2021   NEUTROABS 3,288 10/23/2021    CMP  Lab Results  Component Value Date   NA 140 04/23/2022   K 4.4 04/23/2022   CL 103 04/23/2022   CO2 32 04/23/2022   GLUCOSE 94 04/23/2022   BUN 14 04/23/2022   CREATININE 0.88 04/23/2022   CALCIUM 10.0 04/23/2022   PROT 6.6 04/23/2022   AST 20 04/23/2022   ALT 14 04/23/2022   BILITOT 0.3 04/23/2022    Medications: I have reviewed the patient's current medications.   Assessment/Plan:  Right breast cancer-stage Ia (T1N0), status post a right lumpectomy and sentinel lymph node biopsy March 2013 ER 100%, PR 99%, HER2 0 Adjuvant brachytherapy March 2013 Adjuvant aromatase inhibitors including Femara, Aromasin, and Arimidex March 2013-March 2015-discontinued secondary to severe hot  flashes and rash, trial of tamoxifen discontinued secondary to severe hot flashes Right colon cancer, stage I, T1N0-status post a right colectomy 09/12/2020, tumor invaded the submucosa, 0/12 lymph nodes, no lymphovascular perineural invasion, mismatch repair protein proficient CT abdomen/pelvis negative for metastases Colonoscopy 09/22/2021-negative for polyps  3.   Negative INVITAE genetic panel 4.   Hypothyroidism 5.   G2 P2   Disposition: Amanda Dickson is in clinical remission from breast cancer and colon cancer.  I suspect the small subcutaneous lesion at the medial aspect of right breast is a benign finding.  She will return for repeat exam in 3 months.  She will continue yearly mammography.  Betsy Coder, MD  11/01/2022  10:41 AM

## 2022-11-05 ENCOUNTER — Ambulatory Visit (INDEPENDENT_AMBULATORY_CARE_PROVIDER_SITE_OTHER): Payer: Medicare Other | Admitting: Sports Medicine

## 2022-11-05 DIAGNOSIS — M47816 Spondylosis without myelopathy or radiculopathy, lumbar region: Secondary | ICD-10-CM

## 2022-11-05 DIAGNOSIS — M25552 Pain in left hip: Secondary | ICD-10-CM

## 2022-11-05 HISTORY — DX: Spondylosis without myelopathy or radiculopathy, lumbar region: M47.816

## 2022-11-05 NOTE — Assessment & Plan Note (Signed)
Amanda Dickson also returns, she is a pleasant 72 year old female, they just finished their trip to [place] and things went well. 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 We did a trochanteric bursa injection back in June, did not get much relief, she finally got aggressive with her hip abductor exercises, her strength improved considerably and at the follow-up visit at the last visit she had essentially no pain and her hip abductor's were significantly stronger. I think we have solved her lateral hip pain.  Unfortunately she is continuing to have some back pain, see above.

## 2022-11-05 NOTE — Assessment & Plan Note (Addendum)
Multilevel lumbar degenerative disc disease, increasing low back pain, Amanda Dickson would like to start with aggressive core conditioning before we proceed with epidurals. She does have a good-sized L4-L5 broad-based disc protrusion that would likely be the target for epidural injection if insufficient improvement. Return to see me in 6 weeks.

## 2022-11-05 NOTE — Progress Notes (Signed)
    Procedures performed today:    None.  Independent interpretation of notes and tests performed by another provider:   None.  Brief History, Exam, Impression, and Recommendations:    Left hip pain Amanda Dickson also returns, she is a pleasant 72 year old female, they just finished their trip to [place] and things went well. 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 We did a trochanteric bursa injection back in June, did not get much relief, she finally got aggressive with her hip abductor exercises, her strength improved considerably and at the follow-up visit at the last visit she had essentially no pain and her hip abductor's were significantly stronger. I think we have solved her lateral hip pain.  Unfortunately she is continuing to have some back pain, see above.  Lumbar spondylosis Multilevel lumbar degenerative disc disease, increasing low back pain, Amanda Dickson would like to start with aggressive core conditioning before we proceed with epidurals. She does have a good-sized L4-L5 broad-based disc protrusion that would likely be the target for epidural injection if insufficient improvement. Return to see me in 6 weeks.    ____________________________________________ Gwen Her. Dianah Field, M.D., ABFM., CAQSM., AME. Primary Care and Sports Medicine [facility]  Adjunct Professor of [place] [employer] [facility] of Medicine  Risk manager

## 2022-11-28 ENCOUNTER — Encounter: Payer: Self-pay | Admitting: Family Medicine

## 2022-11-28 ENCOUNTER — Ambulatory Visit (INDEPENDENT_AMBULATORY_CARE_PROVIDER_SITE_OTHER): Payer: Medicare Other | Admitting: Family Medicine

## 2022-11-28 VITALS — BP 134/76 | HR 66 | Ht 65.16 in | Wt 191.0 lb

## 2022-11-28 DIAGNOSIS — Z13220 Encounter for screening for lipoid disorders: Secondary | ICD-10-CM

## 2022-11-28 DIAGNOSIS — Z780 Asymptomatic menopausal state: Secondary | ICD-10-CM

## 2022-11-28 DIAGNOSIS — E039 Hypothyroidism, unspecified: Secondary | ICD-10-CM | POA: Diagnosis not present

## 2022-11-28 DIAGNOSIS — R7309 Other abnormal glucose: Secondary | ICD-10-CM

## 2022-11-28 HISTORY — DX: Asymptomatic menopausal state: Z78.0

## 2022-11-28 NOTE — Patient Instructions (Addendum)
Great to see you today! We'll be in touch with lab results.  See me again in 6 months.

## 2022-11-28 NOTE — Assessment & Plan Note (Signed)
Feels good at current strength on levothyroxine.  Updating TSH.

## 2022-11-28 NOTE — Assessment & Plan Note (Signed)
Obtain BMP and A1c today.

## 2022-11-28 NOTE — Progress Notes (Signed)
Amanda Dickson - 72 y.o. female MRN [id number]  Date of birth: [birth year]  Subjective Chief Complaint  Patient presents with   Follow-up    HPI Amanda Dickson is a 72 year old female here today for follow-up visit.  She reports she is doing well.  She is needing a renewal of her levothyroxine.  She is due to have updated labs.  She feels well with current strength of levothyroxine.  She remains up-to-date on mammogram and colon cancer screening.  She is due for bone density screening.  ROS:  A comprehensive ROS was completed and negative except as noted per HPI  Allergies  Allergen Reactions   Meperidine Hcl     Paralysis feelings    Past Medical History:  Diagnosis Date   Allergy    Arthritis Long ago!!   Breast cancer [facility]) 2013   Right, s/p lumpectomy with brachytherapy   Colon cancer ([place]) 2021   Hypothyroidism     Past Surgical History:  Procedure Laterality Date   APPENDECTOMY  2021   part of colon removal   BREAST SURGERY  2013   Rt. Lumpectomy   CHOLECYSTECTOMY     COLON SURGERY  09-12-2020   COLONOSCOPY  09/06/2020   Dr. Larkin Ina - [facility]. Moderate diverticulosis of the sigmoid colon, Mass (3cm) in the proximal ascending colon.   COLONOSCOPY  10/16/2010   Dr. Larkin Ina - [facility]. Moderate diverticulosis sigmoid colon, grade 1 internal hemorrhoids   HERNIA REPAIR     TONSILLECTOMY     TUBAL LIGATION      Social History   Socioeconomic History   Marital status: Married    Spouse name: Cadance Raus   Number of children: 2   Years of education: 16   Highest education level: Bachelor's degree (e.g., BA, AB, BS)  Occupational History   Occupation: Retired   Occupation: Retired  Tobacco Use   Smoking status: Never    Passive exposure: Never   Smokeless tobacco: Never  Vaping Use   Vaping Use: Never used  Substance and Sexual Activity   Alcohol use: Never   Drug use: Never   Sexual activity: Yes     Partners: Male    Birth control/protection: Post-menopausal  Other Topics Concern   Not on file  Social History Narrative   Lives with her husband. She enjoys sewing, crocheting and travelling.   Social Determinants of Health   Financial Resource Strain: Low Risk  (08/29/2022)   Overall Financial Resource Strain (CARDIA)    Difficulty of Paying Living Expenses: Not hard at all  Food Insecurity: No Food Insecurity (08/29/2022)   Hunger Vital Sign    Worried About Running Out of Food in the Last Year: Never true    Ran Out of Food in the Last Year: Never true  Transportation Needs: No Transportation Needs (08/29/2022)   PRAPARE - Hydrologist (Medical): No    Lack of Transportation (Non-Medical): No  Physical Activity: Sufficiently Active (08/29/2022)   Exercise Vital Sign    Days of Exercise per Week: 5 days    Minutes of Exercise per Session: 30 min  Stress: No Stress Concern Present (08/29/2022)   [place]    Feeling of Stress : Not at all  Social Connections: [place] (08/31/2022)   Social Connection and Isolation Panel [NHANES]    Frequency of Communication with Friends and Family: More than three times a week  Frequency of Social Gatherings with Friends and Family: Once a week    Attends Religious Services: More than 4 times per year    Active Member of Clubs or Organizations: Yes    Attends Music therapist: More than 4 times per year    Marital Status: Married    Family History  Problem Relation Age of Onset   Melanoma Mother    Arthritis Mother    Cancer Mother    Vision loss Mother    Colon cancer Father    Prostate cancer Father    Liver cancer Father    Cancer Father    Colon cancer Maternal Grandfather    Cancer Maternal Grandfather    Crohn's disease Maternal Aunt        Ulcerative colitis   Cancer Brother    Rectal cancer Neg Hx     Esophageal cancer Neg Hx    Pancreatic cancer Neg Hx    Stomach cancer Neg Hx     Health Maintenance  Topic Date Due   COVID-19 Vaccine (2 - Pfizer risk series) 01/31/2023 (Originally 12/06/2021)   Pneumonia Vaccine 104+ Years old (1 - PCV) 05/25/2023 (Originally 11/24/2013)   Hepatitis C Screening  05/25/2023 (Originally 11/24/1968)   DEXA SCAN  10/24/2023 (Originally 11/24/2013)   Medicare Annual Wellness (AWV)  09/01/2023   MAMMOGRAM  06/25/2024   COLONOSCOPY (Pts 45-77yr Insurance coverage will need to be confirmed)  09/22/2024   DTaP/Tdap/Td (2 - Td or Tdap) 08/01/2032   INFLUENZA VACCINE  Completed   Zoster Vaccines- Shingrix  Completed   HPV VACCINES  Aged Out     ----------------------------------------------------------------------------------------------------------------------------------------------------------------------------------------------------------------- Physical Exam BP 134/76 (BP Location: Left Arm, Patient Position: Sitting, Cuff Size: Large)   Pulse 66   Ht 5' 5.16" (1.655 m)   Wt 191 lb (86.6 kg)   SpO2 97%   BMI 31.63 kg/m   Physical Exam Constitutional:      Appearance: Normal appearance.  HENT:     Head: Normocephalic and atraumatic.  Cardiovascular:     Rate and Rhythm: Normal rate and regular rhythm.  Pulmonary:     Effort: Pulmonary effort is normal.     Breath sounds: Normal breath sounds.  Musculoskeletal:     Cervical back: Neck supple.  Neurological:     Mental Status: She is alert.  Psychiatric:        Mood and Affect: Mood normal.        Behavior: Behavior normal.     ------------------------------------------------------------------------------------------------------------------------------------------------------------------------------------------------------------------- Assessment and Plan  Acquired hypothyroidism Feels good at current strength on levothyroxine.  Updating TSH.  Elevated glucose Obtain BMP and A1c  today.  Post-menopausal DEXA ordered.   No orders of the defined types were placed in this encounter.   No follow-ups on file.    This visit occurred during the SARS-CoV-2 public health emergency.  Safety protocols were in place, including screening questions prior to the visit, additional usage of staff PPE, and extensive cleaning of exam room while observing appropriate contact time as indicated for disinfecting solutions.

## 2022-11-28 NOTE — Assessment & Plan Note (Signed)
DEXA ordered.  

## 2022-11-29 ENCOUNTER — Other Ambulatory Visit (HOSPITAL_COMMUNITY)
Admission: AD | Admit: 2022-11-29 | Discharge: 2022-11-29 | Disposition: A | Discharge status: Home / Self Care | Payer: Medicare Other | Attending: Family Medicine | Admitting: Family Medicine

## 2022-11-29 DIAGNOSIS — Z13220 Encounter for screening for lipoid disorders: Secondary | ICD-10-CM | POA: Diagnosis present

## 2022-11-29 DIAGNOSIS — E039 Hypothyroidism, unspecified: Secondary | ICD-10-CM | POA: Insufficient documentation

## 2022-11-29 DIAGNOSIS — R7309 Other abnormal glucose: Secondary | ICD-10-CM | POA: Diagnosis present

## 2022-11-29 LAB — CBC WITH DIFFERENTIAL/PLATELET
Abs Immature Granulocytes: 0.01 10*3/uL (ref 0.00–0.07)
Basophils Absolute: 0 10*3/uL (ref 0.0–0.1)
Basophils Relative: 0 %
Eosinophils Absolute: 0.1 10*3/uL (ref 0.0–0.5)
Eosinophils Relative: 2 %
HCT: 44.7 % (ref 36.0–46.0)
Hemoglobin: 15.3 g/dL — ABNORMAL HIGH (ref 12.0–15.0)
Immature Granulocytes: 0 %
Lymphocytes Relative: 27 %
Lymphs Abs: 1.5 10*3/uL (ref 0.7–4.0)
MCH: 29.5 pg (ref 26.0–34.0)
MCHC: 34.2 g/dL (ref 30.0–36.0)
MCV: 86.3 fL (ref 80.0–100.0)
Monocytes Absolute: 0.4 10*3/uL (ref 0.1–1.0)
Monocytes Relative: 6 %
Neutro Abs: 3.6 10*3/uL (ref 1.7–7.7)
Neutrophils Relative %: 65 %
Platelets: 208 10*3/uL (ref 150–400)
RBC: 5.18 MIL/uL — ABNORMAL HIGH (ref 3.87–5.11)
RDW: 12.6 % (ref 11.5–15.5)
WBC: 5.6 10*3/uL (ref 4.0–10.5)
nRBC: 0 % (ref 0.0–0.2)

## 2022-11-29 LAB — TSH: TSH: 2.299 u[IU]/mL (ref 0.350–4.500)

## 2022-11-29 LAB — COMPREHENSIVE METABOLIC PANEL
ALT: 21 U/L (ref 0–44)
AST: 25 U/L (ref 15–41)
Albumin: 3.8 g/dL (ref 3.5–5.0)
Alkaline Phosphatase: 72 U/L (ref 38–126)
Anion gap: 9 (ref 5–15)
BUN: 19 mg/dL (ref 8–23)
CO2: 28 mmol/L (ref 22–32)
Calcium: 9.5 mg/dL (ref 8.9–10.3)
Chloride: 107 mmol/L (ref 98–111)
Creatinine, Ser: 0.91 mg/dL (ref 0.44–1.00)
GFR, Estimated: >60 mL/min (ref >60)
Glucose, Bld: 115 mg/dL — ABNORMAL HIGH (ref 70–99)
Potassium: 4 mmol/L (ref 3.5–5.1)
Sodium: 144 mmol/L (ref 135–145)
Total Bilirubin: 0.5 mg/dL (ref 0.3–1.2)
Total Protein: 6.9 g/dL (ref 6.5–8.1)

## 2022-11-29 LAB — LIPID PANEL
Cholesterol: 248 mg/dL — ABNORMAL HIGH (ref 0–200)
HDL: 68 mg/dL (ref >40)
LDL Cholesterol: 154 mg/dL — ABNORMAL HIGH (ref 0–99)
Total CHOL/HDL Ratio: 3.6 RATIO
Triglycerides: 128 mg/dL (ref <150)
VLDL: 26 mg/dL (ref 0–40)

## 2022-11-29 LAB — HEMOGLOBIN A1C
Hgb A1c MFr Bld: 6.2 % — ABNORMAL HIGH (ref 4.8–5.6)
Mean Plasma Glucose: 131 mg/dL

## 2022-11-30 ENCOUNTER — Other Ambulatory Visit: Payer: Self-pay | Admitting: Family Medicine

## 2022-11-30 MED ORDER — LEVOTHYROXINE SODIUM 50 MCG PO TABS: 50.0000 ug | ORAL_TABLET | Freq: Every day | ORAL | 3 refills | Status: DC

## 2023-01-08 ENCOUNTER — Ambulatory Visit (HOSPITAL_BASED_OUTPATIENT_CLINIC_OR_DEPARTMENT_OTHER)
Admission: RE | Admit: 2023-01-08 | Discharge: 2023-01-08 | Disposition: A | Discharge status: Home / Self Care | Payer: Medicare Other | Source: Ambulatory Visit | Attending: Family Medicine | Admitting: Family Medicine

## 2023-01-08 DIAGNOSIS — Z780 Asymptomatic menopausal state: Secondary | ICD-10-CM | POA: Diagnosis not present

## 2023-01-09 ENCOUNTER — Other Ambulatory Visit: Payer: Medicare Other

## 2023-01-11 ENCOUNTER — Encounter: Payer: Self-pay | Admitting: Family Medicine

## 2023-02-01 ENCOUNTER — Inpatient Hospital Stay: Payer: Medicare Other | Attending: Oncology | Admitting: Oncology

## 2023-02-01 VITALS — BP 144/74 | HR 70 | Temp 98.1°F | Resp 20 | Ht 65.0 in | Wt 193.2 lb

## 2023-02-01 DIAGNOSIS — Z85038 Personal history of other malignant neoplasm of large intestine: Secondary | ICD-10-CM | POA: Diagnosis present

## 2023-02-01 DIAGNOSIS — Z170 Estrogen receptor positive status [ER+]: Secondary | ICD-10-CM | POA: Diagnosis not present

## 2023-02-01 DIAGNOSIS — Z853 Personal history of malignant neoplasm of breast: Secondary | ICD-10-CM | POA: Insufficient documentation

## 2023-02-01 DIAGNOSIS — C50411 Malignant neoplasm of upper-outer quadrant of right female breast: Secondary | ICD-10-CM | POA: Diagnosis not present

## 2023-02-01 NOTE — Progress Notes (Signed)
  [facility] OFFICE PROGRESS NOTE   Diagnosis: Breast cancer, colon cancer  INTERVAL HISTORY:   Amanda Dickson returns as scheduled.  She feels well.  Good appetite.  No difficulty with bowel function.  No bleeding.  No palpable change over the breasts.  Objective:  Vital signs in last 24 hours:  Blood pressure (!) 144/74, pulse 70, temperature 98.1 F (36.7 C), temperature source Oral, resp. rate 20, height '5\' 5"'$  (1.651 m), weight 193 lb 3.2 oz (87.6 kg), SpO2 98 %.     Lymphatics: No cervical, supraclavicular, axillary, or inguinal nodes Resp: Clear bilaterally Cardio: Rate and rhythm GI: No hepatosplenomegaly, no mass, nontender Vascular: No leg edema Breast: Post right lumpectomy.  No evidence for local tumor recurrence.  No mass in either breast.  At the 3 o'clock position of the right breast, medial aspect there is a 1-1.5 cm cutaneous oval lesion.  The lesion is mobile.  Similar changes at the medial left breast though less discrete   Lab Results:  Lab Results  Component Value Date   WBC 5.6 11/29/2022   HGB 15.3 (H) 11/29/2022   HCT 44.7 11/29/2022   MCV 86.3 11/29/2022   PLT 208 11/29/2022   NEUTROABS 3.6 11/29/2022    CMP  Lab Results  Component Value Date   NA 144 11/29/2022   K 4.0 11/29/2022   CL 107 11/29/2022   CO2 28 11/29/2022   GLUCOSE 115 (H) 11/29/2022   BUN 19 11/29/2022   CREATININE 0.91 11/29/2022   CALCIUM 9.5 11/29/2022   PROT 6.9 11/29/2022   ALBUMIN 3.8 11/29/2022   AST 25 11/29/2022   ALT 21 11/29/2022   ALKPHOS 72 11/29/2022   BILITOT 0.5 11/29/2022   GFRNONAA >60 11/29/2022    Medications: I have reviewed the patient's current medications.   Assessment/Plan:  Right breast cancer-stage Ia (T1N0), status post a right lumpectomy and sentinel lymph node biopsy March 2013 ER 100%, PR 99%, HER2 0 Adjuvant brachytherapy March 2013 Adjuvant aromatase inhibitors including Femara, Aromasin, and Arimidex March  2013-March 2015-discontinued secondary to severe hot flashes and rash, trial of tamoxifen discontinued secondary to severe hot flashes Right colon cancer, stage I, T1N0-status post a right colectomy 09/12/2020, tumor invaded the submucosa, 0/12 lymph nodes, no lymphovascular perineural invasion, mismatch repair protein proficient CT abdomen/pelvis negative for metastases Colonoscopy 09/22/2021-negative for polyps  3.   Negative INVITAE genetic panel 4.   Hypothyroidism 5.   G2 P2    Disposition: Amanda Dickson is in clinical remission from breast cancer and colon cancer.  I suspect the oval cutaneous lesion of the medial right breast is a benign finding.  She will have a screening mammogram in June.  She will return for an office visit in 9 months.  Amanda Dickson will call in the interim as needed.  Betsy Coder, MD  02/01/2023  9:19 AM

## 2023-02-19 ENCOUNTER — Ambulatory Visit (INDEPENDENT_AMBULATORY_CARE_PROVIDER_SITE_OTHER): Payer: Medicare Other | Admitting: Sports Medicine

## 2023-02-19 DIAGNOSIS — M25552 Pain in left hip: Secondary | ICD-10-CM

## 2023-02-19 MED ORDER — NAPROXEN 500 MG PO TABS: ORAL_TABLET | ORAL | 3 refills | Status: DC

## 2023-02-19 NOTE — Assessment & Plan Note (Signed)
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 We did a trochanteric bursa injection back in June with very little benefit, eventually she got aggressive with her hip abductor exercises and symptoms resolved. They have been traveling again, and she has slacked off on the exercises, she will get back aggressive with the hip abductor conditioning, we will also switch her from Celebrex to naproxen. We can see her back in about 4 weeks.

## 2023-02-19 NOTE — Progress Notes (Signed)
    Procedures performed today:    None.  Independent interpretation of notes and tests performed by another provider:   None.  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 We did a trochanteric bursa injection back in June with very little benefit, eventually she got aggressive with her hip abductor exercises and symptoms resolved. They have been traveling again, and she has slacked off on the exercises, she will get back aggressive with the hip abductor conditioning, we will also switch her from Celebrex to naproxen. We can see her back in about 4 weeks.    ____________________________________________ Gwen Her. Dianah Field, M.D., ABFM., CAQSM., AME. Primary Care and Sports Medicine [facility]  Adjunct Professor of [place] [employer] [facility] of Medicine  Risk manager

## 2023-02-24 ENCOUNTER — Encounter (INDEPENDENT_AMBULATORY_CARE_PROVIDER_SITE_OTHER): Payer: Medicare Other | Admitting: Family Medicine

## 2023-02-24 DIAGNOSIS — E039 Hypothyroidism, unspecified: Secondary | ICD-10-CM

## 2023-02-25 MED ORDER — LEVOTHYROXINE SODIUM 25 MCG PO TABS: ORAL_TABLET | ORAL | 1 refills | Status: DC

## 2023-02-25 NOTE — Telephone Encounter (Signed)
Please see the MyChart message reply(ies) for my assessment and plan.    This patient gave consent for this Medical Advice Message and is aware that it may result in a bill to their insurance company, as well as the possibility of receiving a bill for a co-payment or deductible. They are an established patient, but are not seeking medical advice exclusively about a problem treated during an in person or video visit in the last seven days. I did not recommend an in person or video visit within seven days of my reply.    I spent a total of 7 minutes cumulative time within 7 days through MyChart messaging.  Alijah Hyde, DO   

## 2023-02-27 ENCOUNTER — Encounter (INDEPENDENT_AMBULATORY_CARE_PROVIDER_SITE_OTHER): Payer: Medicare Other | Admitting: Sports Medicine

## 2023-02-27 DIAGNOSIS — M25552 Pain in left hip: Secondary | ICD-10-CM

## 2023-02-28 NOTE — Telephone Encounter (Signed)
I spent 5 total minutes of online digital evaluation and management services in this patient-initiated request for online care. 

## 2023-03-06 ENCOUNTER — Ambulatory Visit: Payer: Medicare Other | Attending: Sports Medicine | Admitting: Physical Therapy

## 2023-03-06 ENCOUNTER — Other Ambulatory Visit: Payer: Self-pay

## 2023-03-06 ENCOUNTER — Encounter: Payer: Self-pay | Admitting: Physical Therapy

## 2023-03-06 DIAGNOSIS — M25552 Pain in left hip: Secondary | ICD-10-CM | POA: Diagnosis present

## 2023-03-06 DIAGNOSIS — R29898 Other symptoms and signs involving the musculoskeletal system: Secondary | ICD-10-CM | POA: Diagnosis not present

## 2023-03-06 NOTE — Therapy (Signed)
OUTPATIENT PHYSICAL THERAPY LOWER EXTREMITY EVALUATION   Patient Name: Amanda Dickson MRN: [id number] DOB:[birth year], 73 y.o., female Today's Date: 03/06/2023  END OF SESSION:  PT End of Session - 03/06/23 0955     Visit Number 1    Number of Visits 12    Date for PT Re-Evaluation 04/17/23    Authorization Type Medicare    Authorization - Visit Number 1    Progress Note Due on Visit 10    PT Start Time 0845    PT Stop Time 0921    PT Time Calculation (min) 36 min    Activity Tolerance Patient tolerated treatment well    Behavior During Therapy [facility] for tasks assessed/performed             Past Medical History:  Diagnosis Date   Allergy    Arthritis Long ago!!   Breast cancer ([place]) 2013   Right, s/p lumpectomy with brachytherapy   Colon cancer ([place]) 2021   Hypothyroidism    Past Surgical History:  Procedure Laterality Date   APPENDECTOMY  2021   part of colon removal   BREAST SURGERY  2013   Rt. Lumpectomy   CHOLECYSTECTOMY     COLON SURGERY  09-12-2020   COLONOSCOPY  09/06/2020   Dr. Larkin Ina - [facility]. Moderate diverticulosis of the sigmoid colon, Mass (3cm) in the proximal ascending colon.   COLONOSCOPY  10/16/2010   Dr. Larkin Ina - [facility]. Moderate diverticulosis sigmoid colon, grade 1 internal hemorrhoids   HERNIA REPAIR     TONSILLECTOMY     TUBAL LIGATION     Patient Active Problem List   Diagnosis Date Noted   Post-menopausal 11/28/2022   Lumbar spondylosis 11/05/2022   Elevated glucose 05/24/2022   Cervical spondylosis 04/23/2022   Acquired hypothyroidism 10/23/2021   History of colon resection 10/23/2021   History of colon cancer 10/23/2021   History of breast cancer 10/23/2021   Left hip pain 10/18/2021    PCP: Zigmund Daniel  REFERRING PROVIDER: Dianah Field  REFERRING DIAG: Left hip pain  THERAPY DIAG:  Pain in left hip  Other symptoms and signs involving the musculoskeletal system  Rationale  for Evaluation and Treatment: Rehabilitation  ONSET DATE: 12/2022  SUBJECTIVE:   SUBJECTIVE STATEMENT: Pt has frequent bursitis in Lt hip. Years ago she would get shots and they would help her hip, but in the past 3 years the shots have stopped helping. She saw MD who prescribed exercises and they initially helped, but in the past few months the pain has returned and the exercises have stopped helping. She has pain with the exercises and has pain with walking and with laying down to sleep at night. She has not found any relief.  PERTINENT HISTORY: Chronic bursitis PAIN:  Are you having pain? Yes: NPRS scale: 0/10 currently, 3-5/10 with walking/10 Pain location: left hip Pain description: sore Aggravating factors: walk, exercises, hills Relieving factors: none reported  PRECAUTIONS: None  WEIGHT BEARING RESTRICTIONS: No  FALLS:  Has patient fallen in last 6 months? No   OCCUPATION: retired, enjoys travel and walking, Psychologist, occupational work  PLOF: Independent  PATIENT GOALS: be able to go on walks with her husband who has parkinson's  NEXT MD VISIT: 3/18  OBJECTIVE:    PATIENT SURVEYS:  FOTO 52   PALPATION: TTP Lt greater trochanter, no TTP in glutes or ITB  LOWER EXTREMITY ROM: Bilat hip ROM WFL  LOWER EXTREMITY MMT:  MMT Right eval Left eval  Hip  flexion 4+ 4+  Hip extension 4 4  Hip abduction  4  Hip adduction    Hip internal rotation    Hip external rotation  4-  Knee flexion 4 4  Knee extension    Ankle dorsiflexion    Ankle plantarflexion    Ankle inversion    Ankle eversion     (Blank rows = not tested)   FUNCTIONAL TESTS:  SLS Rt: 3 sec, Lt: unable Able to ascend stairs without rail with Rt foot first, unable to step up with Lt foot without railings due to hip pain     TODAY'S TREATMENT:                                                                                                                              DATE: 03/06/23 See HEP - attempted  modified SL deadlift, painful   PATIENT EDUCATION:  Education details: PT POC and goals, HEP Person educated: Patient Education method: Explanation, Demonstration, and Handouts Education comprehension: verbalized understanding and returned demonstration  HOME EXERCISE PROGRAM: Access Code: [email or web address] URL: [email or web address] Date: 03/06/2023 Prepared by: Isabelle Course  Exercises - Single Leg Bridge  - 1 x daily - 7 x weekly - 3 sets - 10 reps - Single Leg Balance with Clock Reach  - 1 x daily - 7 x weekly - 3 sets - 10 reps - Isometric Gluteus Medius at Wall  - 1 x daily - 7 x weekly - 2 sets - 10 reps - 3 sec hold  ASSESSMENT:  CLINICAL IMPRESSION: Patient is a 73 y.o. female who was seen today for physical therapy evaluation and treatment for Lt hip pain. Pt presents with tenderness to palpation in Lt greater trochanter, decreased Lt hip strength and balance, decreased functional activity tolerance and will benefit from skilled PT to address deficits and improve functional mobility.   OBJECTIVE IMPAIRMENTS: decreased activity tolerance, decreased balance, decreased endurance, decreased mobility, difficulty walking, decreased strength, and pain.   ACTIVITY LIMITATIONS: standing, stairs, and locomotion level  PARTICIPATION LIMITATIONS: community activity  PERSONAL FACTORS: Age and Time since onset of injury/illness/exacerbation are also affecting patient's functional outcome.   REHAB POTENTIAL: Good  CLINICAL DECISION MAKING: Evolving/moderate complexity  EVALUATION COMPLEXITY: Moderate   GOALS: Goals reviewed with patient? Yes  SHORT TERM GOALS: Target date: 03/20/2023   Pt will be independent with initial HEP Baseline: Goal status: INITIAL   LONG TERM GOALS: Target date: 04/17/2023    Pt will be independent with advanced HEP Baseline:  Goal status: INITIAL  2.  Pt will improve FOTO to >= 59 to demo improved functional mobility Baseline:   Goal status: INITIAL  3.  Pt will ascend and descend 4 6'' stairs without railing with Lt hip pain <= 1/10 Baseline:  Goal status: INITIAL  4.  Pt will improve LE strength to 4+/5 to improve standing and walking tolerance Baseline:  Goal status: INITIAL  5.  Pt will tolerate walking  x 30 minutes with Lt hip pain <= 1/10 Baseline:  Goal status: INITIAL    PLAN:  PT FREQUENCY: 1-2x/week  PT DURATION: 6 weeks  PLANNED INTERVENTIONS: Therapeutic exercises, Therapeutic activity, Neuromuscular re-education, Balance training, Gait training, Patient/Family education, Self Care, Joint mobilization, Stair training, Aquatic Therapy, Dry Needling, Electrical stimulation, Cryotherapy, Moist heat, Taping, Ultrasound, Ionotophoresis '4mg'$ /ml Dexamethasone, Manual therapy, and Re-evaluation  PLAN FOR NEXT SESSION: Lt hip/glute strength, balance   Teesha Ohm, PT 03/06/2023, 9:56 AM

## 2023-03-13 ENCOUNTER — Encounter: Payer: Self-pay | Admitting: Physical Therapy

## 2023-03-13 ENCOUNTER — Ambulatory Visit: Payer: Medicare Other | Admitting: Physical Therapy

## 2023-03-13 DIAGNOSIS — M25552 Pain in left hip: Secondary | ICD-10-CM

## 2023-03-13 DIAGNOSIS — R29898 Other symptoms and signs involving the musculoskeletal system: Secondary | ICD-10-CM

## 2023-03-13 NOTE — Therapy (Signed)
OUTPATIENT PHYSICAL THERAPY LOWER EXTREMITY TREATMENT   Patient Name: Amanda Dickson MRN: [id number] DOB:Jul 11, 1950, 73 y.o., female Today's Date: 03/13/2023  END OF SESSION:  PT End of Session - 03/13/23 0923     Visit Number 2    Number of Visits 12    Date for PT Re-Evaluation 04/17/23    Authorization Type Medicare    Authorization - Visit Number 2    Progress Note Due on Visit 10    PT Start Time 0845    PT Stop Time 0925    PT Time Calculation (min) 40 min    Activity Tolerance Patient tolerated treatment well    Behavior During Therapy [facility] for tasks assessed/performed              Past Medical History:  Diagnosis Date   Allergy    Arthritis Long ago!!   Breast cancer [facility]) 2013   Right, s/p lumpectomy with brachytherapy   Colon cancer ([place]) 2021   Hypothyroidism    Past Surgical History:  Procedure Laterality Date   APPENDECTOMY  2021   part of colon removal   BREAST SURGERY  2013   Rt. Lumpectomy   CHOLECYSTECTOMY     COLON SURGERY  09-12-2020   COLONOSCOPY  09/06/2020   Dr. Larkin Ina - [facility]. Moderate diverticulosis of the sigmoid colon, Mass (3cm) in the proximal ascending colon.   COLONOSCOPY  10/16/2010   Dr. Larkin Ina - [facility]. Moderate diverticulosis sigmoid colon, grade 1 internal hemorrhoids   HERNIA REPAIR     TONSILLECTOMY     TUBAL LIGATION     Patient Active Problem List   Diagnosis Date Noted   Post-menopausal 11/28/2022   Lumbar spondylosis 11/05/2022   Elevated glucose 05/24/2022   Cervical spondylosis 04/23/2022   Acquired hypothyroidism 10/23/2021   History of colon resection 10/23/2021   History of colon cancer 10/23/2021   History of breast cancer 10/23/2021   Left hip pain 10/18/2021    PCP: Zigmund Daniel  REFERRING PROVIDER: Dianah Field  REFERRING DIAG: Left hip pain  THERAPY DIAG:  Pain in left hip  Other symptoms and signs involving the musculoskeletal  system  Rationale for Evaluation and Treatment: Rehabilitation  ONSET DATE: 12/2022  SUBJECTIVE:   SUBJECTIVE STATEMENT: Pt states she has been having pain when standing after sitting for about 1 hour. She states she still has pain with walking even with walking with a walking stick  PERTINENT HISTORY: Chronic bursitis Pt has frequent bursitis in Lt hip. Years ago she would get shots and they would help her hip, but in the past 3 years the shots have stopped helping. She saw MD who prescribed exercises and they initially helped, but in the past few months the pain has returned and the exercises have stopped helping. She has pain with the exercises and has pain with walking and with laying down to sleep at night. She has not found any relief. PAIN:  Are you having pain? Yes: NPRS scale: 0/10 currently, 3-5/10 with walking/10 Pain location: left hip Pain description: sore Aggravating factors: walk, exercises, hills Relieving factors: none reported  PRECAUTIONS: None  WEIGHT BEARING RESTRICTIONS: No  FALLS:  Has patient fallen in last 6 months? No   OCCUPATION: retired, enjoys travel and walking, Psychologist, occupational work  PLOF: Independent  PATIENT GOALS: be able to go on walks with her husband who has parkinson's  NEXT MD VISIT: 3/15  OBJECTIVE:    PATIENT SURVEYS:  FOTO 31  PALPATION: TTP Lt greater trochanter, no TTP in glutes or ITB   LOWER EXTREMITY MMT:  MMT Right eval Left eval  Hip flexion 4+ 4+  Hip extension 4 4  Hip abduction  4  Hip adduction    Hip internal rotation    Hip external rotation  4-  Knee flexion 4 4  Knee extension    Ankle dorsiflexion    Ankle plantarflexion    Ankle inversion    Ankle eversion     (Blank rows = not tested)   FUNCTIONAL TESTS:  SLS Rt: 3 sec, Lt: unable Able to ascend stairs without rail with Rt foot first, unable to step up with Lt foot without railings due to hip pain     TODAY'S TREATMENT:                                                                                                                               [facility] Adult PT Treatment:                                                DATE: 03/13/23 Therapeutic Exercise: Nustep L7 x 5 min warm up Attempt ITB stretch with strap, no stretch felt Bridge with marching x 10 Bridge up with bilat LE, down with Lt only x 10 Hip 3 way on slider 2 x 10 focus on Lt LE stance Modified runner's step up 4'' step x 20 with 1 UE support Leg press DL 115# x 10, SL LT only 2 x 10 65# Hip abd isometric ball on wall 10 x 5 sec hold bilat Side step red TB 3 x 20' bilat Backward walk red TB 3 x 20' Quadruped donkey kick 2 x 10 bilat Manual Therapy: STM and rolling pin Lt ITB   DATE: 03/06/23 See HEP - attempted modified SL deadlift, painful   PATIENT EDUCATION:  Education details: PT POC and goals, HEP Person educated: Patient Education method: Explanation, Demonstration, and Handouts Education comprehension: verbalized understanding and returned demonstration  HOME EXERCISE PROGRAM: Access Code: [email or web address] URL: [email or web address] Date: 03/06/2023 Prepared by: Isabelle Course  Exercises - Single Leg Bridge  - 1 x daily - 7 x weekly - 3 sets - 10 reps - Single Leg Balance with Clock Reach  - 1 x daily - 7 x weekly - 3 sets - 10 reps - Isometric Gluteus Medius at Wall  - 1 x daily - 7 x weekly - 2 sets - 10 reps - 3 sec hold  ASSESSMENT:  CLINICAL IMPRESSION: Pt with some increased ITB pain with backward walking and with step up attempts at 6'' step. Pt with good tolerance to addition of glute strengthening. Good response to manual work. Pt is going out of town for 2 weeks and will return to PT when she is back  in town  OBJECTIVE IMPAIRMENTS: decreased activity tolerance, decreased balance, decreased endurance, decreased mobility, difficulty walking, decreased strength, and pain.     GOALS: Goals reviewed with patient? Yes  SHORT  TERM GOALS: Target date: 03/20/2023   Pt will be independent with initial HEP Baseline: Goal status: INITIAL   LONG TERM GOALS: Target date: 04/17/2023    Pt will be independent with advanced HEP Baseline:  Goal status: INITIAL  2.  Pt will improve FOTO to >= 59 to demo improved functional mobility Baseline:  Goal status: INITIAL  3.  Pt will ascend and descend 4 6'' stairs without railing with Lt hip pain <= 1/10 Baseline:  Goal status: INITIAL  4.  Pt will improve LE strength to 4+/5 to improve standing and walking tolerance Baseline:  Goal status: INITIAL  5.  Pt will tolerate walking x 30 minutes with Lt hip pain <= 1/10 Baseline:  Goal status: INITIAL    PLAN:  PT FREQUENCY: 1-2x/week  PT DURATION: 6 weeks  PLANNED INTERVENTIONS: Therapeutic exercises, Therapeutic activity, Neuromuscular re-education, Balance training, Gait training, Patient/Family education, Self Care, Joint mobilization, Stair training, Aquatic Therapy, Dry Needling, Electrical stimulation, Cryotherapy, Moist heat, Taping, Ultrasound, Ionotophoresis '4mg'$ /ml Dexamethasone, Manual therapy, and Re-evaluation  PLAN FOR NEXT SESSION: Lt hip/glute strength, balance   Nishanth Mccaughan, PT 03/13/2023, 9:26 AM

## 2023-03-15 ENCOUNTER — Ambulatory Visit (INDEPENDENT_AMBULATORY_CARE_PROVIDER_SITE_OTHER): Payer: Medicare Other | Admitting: Sports Medicine

## 2023-03-15 DIAGNOSIS — M47816 Spondylosis without myelopathy or radiculopathy, lumbar region: Secondary | ICD-10-CM

## 2023-03-15 DIAGNOSIS — M25552 Pain in left hip: Secondary | ICD-10-CM

## 2023-03-15 NOTE — Assessment & Plan Note (Signed)
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 She did use some of her husbands Voltaren gel which also seems to help so I will have her continue this 4 times a day. She is doing formal physical therapy, she will continue this for at least another 6 weeks.

## 2023-03-15 NOTE — Assessment & Plan Note (Signed)
Amanda Dickson also has worsening of pain typically at night, this is not typical for a hip abductor tendinosis but more typical for lumbar spinal stenosis, she did have a lumbar spine MRI that showed multilevel lumbar disc protrusions and multiple levels of neuroforaminal stenosis. She is doing physical therapy, at this point we are going to proceed with a left L4-L5 interlaminar epidural.

## 2023-03-15 NOTE — Progress Notes (Signed)
    Procedures performed today:    None.  Independent interpretation of notes and tests performed by another provider:   None.  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 She did use some of her husbands Voltaren gel which also seems to help so I will have her continue this 4 times a day. She is doing formal physical therapy, she will continue this for at least another 6 weeks.  Lumbar spondylosis Liana also has worsening of pain typically at night, this is not typical for a hip abductor tendinosis but more typical for lumbar spinal stenosis, she did have a lumbar spine MRI that showed multilevel lumbar disc protrusions and multiple levels of neuroforaminal stenosis. She is doing physical therapy, at this point we are going to proceed with a left L4-L5 interlaminar epidural.  I spent 30 minutes of total time managing this patient today, this includes chart review, face to face, and non-face to face time.  ____________________________________________ Gwen Her. Dianah Field, M.D., ABFM., CAQSM., AME. Primary Care and Sports Medicine [facility]  Adjunct Professor of [employer] of Medicine  Risk manager

## 2023-03-18 ENCOUNTER — Ambulatory Visit: Payer: Medicare Other | Admitting: Sports Medicine

## 2023-03-27 LAB — COMPREHENSIVE METABOLIC PANEL: eGFR: 61

## 2023-03-27 LAB — HEMOGLOBIN A1C: Hemoglobin A1C: 6.2

## 2023-03-27 LAB — BASIC METABOLIC PANEL: Creatinine: 1 (ref 0.5–1.1)

## 2023-03-28 ENCOUNTER — Encounter: Payer: Self-pay | Admitting: Physical Therapy

## 2023-03-28 ENCOUNTER — Ambulatory Visit: Payer: Medicare Other | Admitting: Physical Therapy

## 2023-03-28 DIAGNOSIS — M25552 Pain in left hip: Secondary | ICD-10-CM | POA: Diagnosis not present

## 2023-03-28 DIAGNOSIS — R29898 Other symptoms and signs involving the musculoskeletal system: Secondary | ICD-10-CM

## 2023-03-28 NOTE — Therapy (Addendum)
OUTPATIENT PHYSICAL THERAPY LOWER EXTREMITY TREATMENT AND DISCHARGE   Patient Name: Amanda Dickson MRN: [id number] DOB:1950/01/09, 73 y.o., female Today's Date: 03/28/2023  END OF SESSION:  PT End of Session - 03/28/23 4098     Visit Number 3    Number of Visits 12    Date for PT Re-Evaluation 04/17/23    Authorization Type Medicare    Authorization - Visit Number 3    Progress Note Due on Visit 10    PT Start Time 0843    PT Stop Time 0922    PT Time Calculation (min) 39 min    Activity Tolerance Patient tolerated treatment well    Behavior During Therapy [facility] for tasks assessed/performed               Past Medical History:  Diagnosis Date   Allergy    Arthritis Long ago!!   Breast cancer [facility]) 2013   Right, s/p lumpectomy with brachytherapy   Colon cancer (HCC) 2021   Hypothyroidism    Past Surgical History:  Procedure Laterality Date   APPENDECTOMY  2021   part of colon removal   BREAST SURGERY  2013   Rt. Lumpectomy   CHOLECYSTECTOMY     COLON SURGERY  09-12-2020   COLONOSCOPY  09/06/2020   Dr. Desmond Lope - [facility]. Moderate diverticulosis of the sigmoid colon, Mass (3cm) in the proximal ascending colon.   COLONOSCOPY  10/16/2010   Dr. Desmond Lope - [facility]. Moderate diverticulosis sigmoid colon, grade 1 internal hemorrhoids   HERNIA REPAIR     TONSILLECTOMY     TUBAL LIGATION     Patient Active Problem List   Diagnosis Date Noted   Post-menopausal 11/28/2022   Lumbar spondylosis 11/05/2022   Elevated glucose 05/24/2022   Cervical spondylosis 04/23/2022   Acquired hypothyroidism 10/23/2021   History of colon resection 10/23/2021   History of colon cancer 10/23/2021   History of breast cancer 10/23/2021   Left hip pain 10/18/2021    PCP: Ashley Royalty  REFERRING PROVIDER: Benjamin Stain  REFERRING DIAG: Left hip pain  THERAPY DIAG:  Pain in left hip  Other symptoms and signs involving the musculoskeletal  system  Rationale for Evaluation and Treatment: Rehabilitation  ONSET DATE: 12/2022  SUBJECTIVE:   SUBJECTIVE STATEMENT: Pt states she had decreased pain on vacation. She was able to walk up steps without "pulling myself up". She was able to do some walking at a brisk pace. Pain did not return until yesterday - no known onset  PERTINENT HISTORY: Chronic bursitis Pt has frequent bursitis in Lt hip. Years ago she would get shots and they would help her hip, but in the past 3 years the shots have stopped helping. She saw MD who prescribed exercises and they initially helped, but in the past few months the pain has returned and the exercises have stopped helping. She has pain with the exercises and has pain with walking and with laying down to sleep at night. She has not found any relief. PAIN:  Are you having pain? Yes: NPRS scale: 0/10 currently, 3-5/10 with walking/10 Pain location: left hip Pain description: sore Aggravating factors: walk, exercises, hills Relieving factors: none reported  PRECAUTIONS: None  WEIGHT BEARING RESTRICTIONS: No  FALLS:  Has patient fallen in last 6 months? No   OCCUPATION: retired, enjoys travel and walking, Agricultural consultant work  PLOF: Independent  PATIENT GOALS: be able to go on walks with her husband who has parkinson's  NEXT MD VISIT:  3/15  OBJECTIVE:    PATIENT SURVEYS:  FOTO 52 FOTO 67 (3/28)  PALPATION: TTP Lt greater trochanter, no TTP in glutes or ITB   LOWER EXTREMITY MMT:  MMT Right eval Left eval  Hip flexion 4+ 4+  Hip extension 4 4  Hip abduction  4  Hip adduction    Hip internal rotation    Hip external rotation  4-  Knee flexion 4 4  Knee extension    Ankle dorsiflexion    Ankle plantarflexion    Ankle inversion    Ankle eversion     (Blank rows = not tested)   FUNCTIONAL TESTS:  SLS Rt: 3 sec, Lt: unable Able to ascend stairs without rail with Rt foot first, unable to step up with Lt foot without railings due  to hip pain     TODAY'S TREATMENT:                                                                                                                              [facility] Adult PT Treatment:                                                DATE: 03/28/23 Therapeutic Exercise: Nustep L7 x 5 min Sidelying hip abd x 15 with cues for technique Sidelying hip arcs extension to flexion x 12 Single leg bridge 2 x 10 bilat Standing hip abd isometric ball on wall 10 x 3 sec hold bilat Sit <> stand 5# 2 x 10  Self Care: Finalization of HEP and education on energy conservation, exercise frequency/duration  [facility] Adult PT Treatment:                                                DATE: 03/13/23 Therapeutic Exercise: Nustep L7 x 5 min warm up Attempt ITB stretch with strap, no stretch felt Bridge with marching x 10 Bridge up with bilat LE, down with Lt only x 10 Hip 3 way on slider 2 x 10 focus on Lt LE stance Modified runner's step up 4'' step x 20 with 1 UE support Leg press DL 161# x 10, SL LT only 2 x 10 65# Hip abd isometric ball on wall 10 x 5 sec hold bilat Side step red TB 3 x 20' bilat Backward walk red TB 3 x 20' Quadruped donkey kick 2 x 10 bilat Manual Therapy: STM and rolling pin Lt ITB    PATIENT EDUCATION:  Education details: PT POC and goals, HEP Person educated: Patient Education method: Programmer, multimedia, Demonstration, and Handouts Education comprehension: verbalized understanding and returned demonstration  HOME EXERCISE PROGRAM: Access Code: [email or web address] URL: [email or web address] Date: 03/28/2023 Prepared by: Reggy Eye  Exercises - Single Leg Bridge  - 1 x daily - 7 x weekly - 3 sets - 10 reps - Sidelying Hip Abduction  - 1 x daily - 7 x weekly - 3 sets - 10 reps - Sidelying Hip Extension in Abduction  - 1 x daily - 7 x weekly - 3 sets - 10 reps - Isometric Gluteus Medius at Wall  - 1 x daily - 7 x weekly - 2 sets - 10 reps - 3 sec hold - Sit to Stand  Without Arm Support  - 1 x daily - 7 x weekly - 3 sets - 10 reps  ASSESSMENT:  CLINICAL IMPRESSION: Pt with much decreased pain, still some hip weakness but improved ability to walk and negotiate stairs. She feels comfortable to hold PT at this time. Session focused on finalizing HEP to maintain strength to reduce "flare ups" of bursitis  OBJECTIVE IMPAIRMENTS: decreased activity tolerance, decreased balance, decreased endurance, decreased mobility, difficulty walking, decreased strength, and pain.     GOALS: Goals reviewed with patient? Yes  SHORT TERM GOALS: Target date: 03/20/2023   Pt will be independent with initial HEP Baseline: Goal status: MET   LONG TERM GOALS: Target date: 04/17/2023    Pt will be independent with advanced HEP Baseline:  Goal status: INITIAL  2.  Pt will improve FOTO to >= 59 to demo improved functional mobility Baseline:  Goal status: MET  3.  Pt will ascend and descend 4 6'' stairs without railing with Lt hip pain <= 1/10 Baseline:  Goal status: MET  4.  Pt will improve LE strength to 4+/5 to improve standing and walking tolerance Baseline:  Goal status: INITIAL  5.  Pt will tolerate walking x 30 minutes with Lt hip pain <= 1/10 Baseline:  Goal status: MET    PLAN:  PT FREQUENCY: 1-2x/week  PT DURATION: 6 weeks  PLANNED INTERVENTIONS: Therapeutic exercises, Therapeutic activity, Neuromuscular re-education, Balance training, Gait training, Patient/Family education, Self Care, Joint mobilization, Stair training, Aquatic Therapy, Dry Needling, Electrical stimulation, Cryotherapy, Moist heat, Taping, Ultrasound, Ionotophoresis 4mg /ml Dexamethasone, Manual therapy, and Re-evaluation  PLAN FOR NEXT SESSION: Lt hip/glute strength, balance  PHYSICAL THERAPY DISCHARGE SUMMARY  Visits from Start of Care: 3  Current functional level related to goals / functional outcomes: Decreased pain, improved strength   Remaining deficits: See  above   Education / Equipment: HEP   Patient agrees to discharge. Patient goals were partially met. Patient is being discharged due to being pleased with the current functional level.  Reggy Eye, PT,DPT04/30/241:46 PM   Jakia Kennebrew, PT 03/28/2023, 9:23 AM

## 2023-04-19 ENCOUNTER — Ambulatory Visit
Admission: RE | Admit: 2023-04-19 | Discharge: 2023-04-19 | Disposition: A | Discharge status: Home / Self Care | Payer: Medicare Other | Source: Ambulatory Visit | Attending: Sports Medicine | Admitting: Sports Medicine

## 2023-04-19 DIAGNOSIS — M47816 Spondylosis without myelopathy or radiculopathy, lumbar region: Secondary | ICD-10-CM

## 2023-04-19 MED ORDER — IOPAMIDOL (ISOVUE-M 200) INJECTION 41%
1.0000 mL | Freq: Once | INTRAMUSCULAR | Status: AC
  Administered 2023-04-19: 1 mL via EPIDURAL

## 2023-04-19 MED ORDER — METHYLPREDNISOLONE ACETATE 40 MG/ML INJ SUSP (RADIOLOG
80.0000 mg | Freq: Once | INTRAMUSCULAR | Status: AC
  Administered 2023-04-19: 80 mg via EPIDURAL

## 2023-04-19 NOTE — Discharge Instructions (Signed)

## 2023-04-29 ENCOUNTER — Ambulatory Visit (INDEPENDENT_AMBULATORY_CARE_PROVIDER_SITE_OTHER): Payer: Medicare Other | Admitting: Sports Medicine

## 2023-04-29 DIAGNOSIS — M47816 Spondylosis without myelopathy or radiculopathy, lumbar region: Secondary | ICD-10-CM

## 2023-04-29 DIAGNOSIS — M25552 Pain in left hip: Secondary | ICD-10-CM | POA: Diagnosis not present

## 2023-04-29 MED ORDER — CELECOXIB 200 MG PO CAPS: ORAL_CAPSULE | ORAL | 3 refills | Status: DC

## 2023-04-29 NOTE — Assessment & Plan Note (Signed)
Overall doing well, she did have back pain worse at night, lumbar spine MRI did show multilevel lumbar disc protrusions with areas of neuroforaminal stenosis, she had done physical therapy, we proceeded with a left L4-L5 interlaminar epidural that seem to help significantly, she does still have a bit of nocturnal pain I have offered muscle relaxers at night, she will let me know. She does not feel as though her pain is bad enough to do another epidural. Hip is doing well currently.

## 2023-04-29 NOTE — Progress Notes (Signed)
    Procedures performed today:    None.  Independent interpretation of notes and tests performed by another provider:   None.  Brief History, Exam, Impression, and Recommendations:    Lumbar spondylosis Overall doing well, she did have back pain worse at night, lumbar spine MRI did show multilevel lumbar disc protrusions with areas of neuroforaminal stenosis, she had done physical therapy, we proceeded with a left L4-L5 interlaminar epidural that seem to help significantly, she does still have a bit of nocturnal pain I have offered muscle relaxers at night, she will let me know. She does not feel as though her pain is bad enough to do another epidural. Hip is doing well currently.  I spent 30 minutes of total time managing this patient today, this includes chart review, face to face, and non-face to face time.  ____________________________________________ Ihor Austin. Benjamin Stain, M.D., ABFM., CAQSM., AME. Primary Care and Sports Medicine [facility]  Adjunct Professor of Family Medicine  [employer]  Restaurant manager, fast food

## 2023-05-11 ENCOUNTER — Encounter: Payer: Self-pay | Admitting: Sports Medicine

## 2023-05-11 DIAGNOSIS — M47816 Spondylosis without myelopathy or radiculopathy, lumbar region: Secondary | ICD-10-CM

## 2023-05-13 MED ORDER — METHOCARBAMOL 500 MG PO TABS: 500.0000 mg | ORAL_TABLET | Freq: Three times a day (TID) | ORAL | 0 refills | Status: DC

## 2023-05-14 ENCOUNTER — Encounter: Payer: Self-pay | Admitting: Sports Medicine

## 2023-05-28 ENCOUNTER — Ambulatory Visit: Payer: Medicare Other | Admitting: Family Medicine

## 2023-06-05 ENCOUNTER — Ambulatory Visit (INDEPENDENT_AMBULATORY_CARE_PROVIDER_SITE_OTHER): Payer: Medicare Other | Admitting: Family Medicine

## 2023-06-05 ENCOUNTER — Encounter: Payer: Self-pay | Admitting: Family Medicine

## 2023-06-05 VITALS — BP 138/81 | HR 66 | Ht 65.0 in | Wt 196.0 lb

## 2023-06-05 DIAGNOSIS — R7303 Prediabetes: Secondary | ICD-10-CM

## 2023-06-05 DIAGNOSIS — M47816 Spondylosis without myelopathy or radiculopathy, lumbar region: Secondary | ICD-10-CM

## 2023-06-05 DIAGNOSIS — M8580 Other specified disorders of bone density and structure, unspecified site: Secondary | ICD-10-CM

## 2023-06-05 DIAGNOSIS — E039 Hypothyroidism, unspecified: Secondary | ICD-10-CM | POA: Diagnosis not present

## 2023-06-05 HISTORY — DX: Prediabetes: R73.03

## 2023-06-05 NOTE — Assessment & Plan Note (Signed)
Seeing Dr. Benjamin Stain.  Doing home exercises and remains on celebrex.

## 2023-06-05 NOTE — Assessment & Plan Note (Signed)
A1c at 6.2% in March.  Encouraged dietary changes and exercise as tolerated  Repeat A1c at next visit.

## 2023-06-05 NOTE — Progress Notes (Signed)
Amanda Dickson - 73 y.o. female MRN [id number]  Date of birth: 1950/01/26  Subjective Chief Complaint  Patient presents with   Follow-up    HPI Amanda Dickson is a 73 y.o. female here today for follow up visit.   She reports that she is doing well.  Seen by Dr. Benjamin Stain recently for back and hip pain.  Completed PT and Robaxin added.  She reports that she continues to have some back pain.  Remains on levothyroxine.  Feels well with current strength and dosing.  Her last TSH was WNL.   She did have some labs completed at a health screening and her A1c was elevated at 6.2% in the prediabetes range.   ROS:  A comprehensive ROS was completed and negative except as noted per HPI  Allergies  Allergen Reactions   Meperidine Hcl     Paralysis feelings    Past Medical History:  Diagnosis Date   Allergy    Arthritis Long ago!!   Breast cancer [facility]) 2013   Right, s/p lumpectomy with brachytherapy   Colon cancer (HCC) 2021   Hypothyroidism     Past Surgical History:  Procedure Laterality Date   APPENDECTOMY  2021   part of colon removal   BREAST SURGERY  2013   Rt. Lumpectomy   CHOLECYSTECTOMY     COLON SURGERY  09-12-2020   COLONOSCOPY  09/06/2020   Dr. Desmond Lope - [facility]. Moderate diverticulosis of the sigmoid colon, Mass (3cm) in the proximal ascending colon.   COLONOSCOPY  10/16/2010   Dr. Desmond Lope - [facility]. Moderate diverticulosis sigmoid colon, grade 1 internal hemorrhoids   HERNIA REPAIR     TONSILLECTOMY     TUBAL LIGATION      Social History   Socioeconomic History   Marital status: Married    Spouse name: Ethelyn Krengel   Number of children: 2   Years of education: 16   Highest education level: Bachelor's degree (e.g., BA, AB, BS)  Occupational History   Occupation: Retired   Occupation: Retired  Tobacco Use   Smoking status: Never    Passive exposure: Never   Smokeless tobacco: Never  Vaping Use    Vaping Use: Never used  Substance and Sexual Activity   Alcohol use: Never   Drug use: Never   Sexual activity: Yes    Partners: Male    Birth control/protection: Post-menopausal  Other Topics Concern   Not on file  Social History Narrative   Lives with her husband. She enjoys sewing, crocheting and travelling.   Social Determinants of Health   Financial Resource Strain: Low Risk  (04/25/2023)   Overall Financial Resource Strain (CARDIA)    Difficulty of Paying Living Expenses: Not hard at all  Food Insecurity: No Food Insecurity (04/25/2023)   Hunger Vital Sign    Worried About Running Out of Food in the Last Year: Never true    Ran Out of Food in the Last Year: Never true  Transportation Needs: No Transportation Needs (04/25/2023)   PRAPARE - Administrator, Civil Service (Medical): No    Lack of Transportation (Non-Medical): No  Physical Activity: Insufficiently Active (04/25/2023)   Exercise Vital Sign    Days of Exercise per Week: 3 days    Minutes of Exercise per Session: 30 min  Stress: No Stress Concern Present (04/25/2023)   [employer] of Occupational Health - Occupational Stress Questionnaire    Feeling of Stress : Not at all  Social Connections: Socially Integrated (04/25/2023)   Social Connection and Isolation Panel [NHANES]    Frequency of Communication with Friends and Family: More than three times a week    Frequency of Social Gatherings with Friends and Family: Twice a week    Attends Religious Services: More than 4 times per year    Active Member of [employer] or Organizations: Yes    Attends Engineer, structural: More than 4 times per year    Marital Status: Married    Family History  Problem Relation Age of Onset   Melanoma Mother    Arthritis Mother    Cancer Mother    Vision loss Mother    Colon cancer Father    Prostate cancer Father    Liver cancer Father    Cancer Father    Colon cancer Maternal Grandfather    Cancer  Maternal Grandfather    Crohn's disease Maternal Aunt        Ulcerative colitis   Cancer Brother    Rectal cancer Neg Hx    Esophageal cancer Neg Hx    Pancreatic cancer Neg Hx    Stomach cancer Neg Hx     Health Maintenance  Topic Date Due   Pneumonia Vaccine 30+ Years old (1 of 1 - PCV) 11/24/2013   COVID-19 Vaccine (3 - Pfizer risk series) 09/21/2023 (Originally 11/29/2022)   Hepatitis C Screening  06/04/2024 (Originally 11/24/1968)   INFLUENZA VACCINE  08/01/2023   Medicare Annual Wellness (AWV)  09/01/2023   MAMMOGRAM  06/25/2024   Colonoscopy  09/22/2024   DTaP/Tdap/Td (2 - Td or Tdap) 08/01/2032   DEXA SCAN  Completed   Zoster Vaccines- Shingrix  Completed   HPV VACCINES  Aged Out     ----------------------------------------------------------------------------------------------------------------------------------------------------------------------------------------------------------------- Physical Exam BP 138/81 (BP Location: Right Arm, Patient Position: Sitting, Cuff Size: Normal)   Pulse 66   Ht 5\' 5"  (1.651 m)   Wt 196 lb (88.9 kg)   SpO2 98%   BMI 32.62 kg/m   Physical Exam Constitutional:      Appearance: Normal appearance.  HENT:     Head: Normocephalic and atraumatic.  Eyes:     General: No scleral icterus. Cardiovascular:     Rate and Rhythm: Normal rate and regular rhythm.  Pulmonary:     Effort: Pulmonary effort is normal.     Breath sounds: Normal breath sounds.  Neurological:     Mental Status: She is alert.  Psychiatric:        Mood and Affect: Mood normal.        Behavior: Behavior normal.     ------------------------------------------------------------------------------------------------------------------------------------------------------------------------------------------------------------------- Assessment and Plan  Prediabetes A1c at 6.2% in March.  Encouraged dietary changes and exercise as tolerated  Repeat A1c at next visit.    Acquired hypothyroidism Update TSH today.   Lumbar spondylosis Seeing Dr. Benjamin Stain.  Doing home exercises and remains on celebrex.    No orders of the defined types were placed in this encounter.   No follow-ups on file.    This visit occurred during the SARS-CoV-2 public health emergency.  Safety protocols were in place, including screening questions prior to the visit, additional usage of staff PPE, and extensive cleaning of exam room while observing appropriate contact time as indicated for disinfecting solutions.

## 2023-06-05 NOTE — Assessment & Plan Note (Signed)
Update TSH today. 

## 2023-06-06 LAB — CBC WITH DIFFERENTIAL/PLATELET
Absolute Monocytes: 385 cells/uL (ref 200–950)
Basophils Absolute: 21 cells/uL (ref 0–200)
Basophils Relative: 0.4 %
Eosinophils Absolute: 99 cells/uL (ref 15–500)
Eosinophils Relative: 1.9 %
HCT: 42 % (ref 35.0–45.0)
Hemoglobin: 14 g/dL (ref 11.7–15.5)
Lymphs Abs: 1342 cells/uL (ref 850–3900)
MCH: 29 pg (ref 27.0–33.0)
MCHC: 33.3 g/dL (ref 32.0–36.0)
MCV: 87.1 fL (ref 80.0–100.0)
MPV: 10.1 fL (ref 7.5–12.5)
Monocytes Relative: 7.4 %
Neutro Abs: 3354 cells/uL (ref 1500–7800)
Neutrophils Relative %: 64.5 %
Platelets: 217 10*3/uL (ref 140–400)
RBC: 4.82 10*6/uL (ref 3.80–5.10)
RDW: 12.4 % (ref 11.0–15.0)
Total Lymphocyte: 25.8 %
WBC: 5.2 10*3/uL (ref 3.8–10.8)

## 2023-06-06 LAB — COMPLETE METABOLIC PANEL WITH GFR
AG Ratio: 1.6 (calc) (ref 1.0–2.5)
ALT: 20 U/L (ref 6–29)
AST: 27 U/L (ref 10–35)
Albumin: 4.2 g/dL (ref 3.6–5.1)
Alkaline phosphatase (APISO): 86 U/L (ref 37–153)
BUN: 22 mg/dL (ref 7–25)
CO2: 25 mmol/L (ref 20–32)
Calcium: 9.2 mg/dL (ref 8.6–10.4)
Chloride: 106 mmol/L (ref 98–110)
Creat: 0.85 mg/dL (ref 0.60–1.00)
Globulin: 2.6 g/dL (calc) (ref 1.9–3.7)
Glucose, Bld: 113 mg/dL — ABNORMAL HIGH (ref 65–99)
Potassium: 4.6 mmol/L (ref 3.5–5.3)
Sodium: 143 mmol/L (ref 135–146)
Total Bilirubin: 0.6 mg/dL (ref 0.2–1.2)
Total Protein: 6.8 g/dL (ref 6.1–8.1)
eGFR: 72 mL/min/{1.73_m2} (ref >=60)

## 2023-06-06 LAB — LIPID PANEL W/REFLEX DIRECT LDL
Cholesterol: 221 mg/dL — ABNORMAL HIGH (ref <200)
HDL: 67 mg/dL (ref >=50)
LDL Cholesterol (Calc): 132 mg/dL (calc) — ABNORMAL HIGH
Non-HDL Cholesterol (Calc): 154 mg/dL (calc) — ABNORMAL HIGH (ref <130)
Total CHOL/HDL Ratio: 3.3 (calc) (ref <5.0)
Triglycerides: 112 mg/dL (ref <150)

## 2023-06-06 LAB — TSH: TSH: 1.46 mIU/L (ref 0.40–4.50)

## 2023-06-28 ENCOUNTER — Encounter (INDEPENDENT_AMBULATORY_CARE_PROVIDER_SITE_OTHER): Payer: Medicare Other | Admitting: Sports Medicine

## 2023-06-28 DIAGNOSIS — M47816 Spondylosis without myelopathy or radiculopathy, lumbar region: Secondary | ICD-10-CM | POA: Diagnosis not present

## 2023-06-28 NOTE — Telephone Encounter (Signed)
I spent 5 total minutes of online digital evaluation and management services in this patient-initiated request for online care. 

## 2023-07-01 ENCOUNTER — Ambulatory Visit (HOSPITAL_BASED_OUTPATIENT_CLINIC_OR_DEPARTMENT_OTHER)
Admission: RE | Admit: 2023-07-01 | Discharge: 2023-07-01 | Disposition: A | Discharge status: Home / Self Care | Payer: Medicare Other | Source: Ambulatory Visit | Attending: Oncology | Admitting: Oncology

## 2023-07-01 ENCOUNTER — Encounter (HOSPITAL_BASED_OUTPATIENT_CLINIC_OR_DEPARTMENT_OTHER): Payer: Self-pay

## 2023-07-01 DIAGNOSIS — Z170 Estrogen receptor positive status [ER+]: Secondary | ICD-10-CM | POA: Insufficient documentation

## 2023-07-01 DIAGNOSIS — Z1231 Encounter for screening mammogram for malignant neoplasm of breast: Secondary | ICD-10-CM | POA: Insufficient documentation

## 2023-07-01 DIAGNOSIS — C50411 Malignant neoplasm of upper-outer quadrant of right female breast: Secondary | ICD-10-CM | POA: Diagnosis present

## 2023-07-10 ENCOUNTER — Ambulatory Visit
Admission: RE | Admit: 2023-07-10 | Discharge: 2023-07-10 | Disposition: A | Discharge status: Home / Self Care | Payer: Medicare Other | Source: Ambulatory Visit | Attending: Sports Medicine | Admitting: Sports Medicine

## 2023-07-10 DIAGNOSIS — M47816 Spondylosis without myelopathy or radiculopathy, lumbar region: Secondary | ICD-10-CM

## 2023-07-10 MED ORDER — IOPAMIDOL (ISOVUE-M 200) INJECTION 41%
1.0000 mL | Freq: Once | INTRAMUSCULAR | Status: AC
  Administered 2023-07-10: 1 mL via EPIDURAL

## 2023-07-10 MED ORDER — METHYLPREDNISOLONE ACETATE 40 MG/ML INJ SUSP (RADIOLOG
80.0000 mg | Freq: Once | INTRAMUSCULAR | Status: AC
  Administered 2023-07-10: 80 mg via EPIDURAL

## 2023-07-10 NOTE — Discharge Instructions (Signed)

## 2023-08-01 ENCOUNTER — Encounter: Payer: Self-pay | Admitting: Family Medicine

## 2023-08-01 NOTE — Telephone Encounter (Signed)
A user error has taken place: encounter opened in error, closed for administrative reasons.

## 2023-08-02 ENCOUNTER — Telehealth: Payer: Self-pay | Admitting: Family Medicine

## 2023-08-02 NOTE — Telephone Encounter (Signed)
Pt called to request TOC from Dr Everrett Coombe to Dr. Arva Chafe. [place] already approved request. Please advise if TOC is okay.

## 2023-08-18 ENCOUNTER — Other Ambulatory Visit: Payer: Self-pay | Admitting: Sports Medicine

## 2023-08-18 DIAGNOSIS — M47816 Spondylosis without myelopathy or radiculopathy, lumbar region: Secondary | ICD-10-CM

## 2023-08-19 MED ORDER — METHOCARBAMOL 500 MG PO TABS
500.0000 mg | ORAL_TABLET | Freq: Three times a day (TID) | ORAL | 0 refills | Status: DC
Start: 2023-08-19 — End: 2023-10-18

## 2023-08-28 ENCOUNTER — Encounter: Payer: Self-pay | Admitting: Sports Medicine

## 2023-09-09 ENCOUNTER — Ambulatory Visit (INDEPENDENT_AMBULATORY_CARE_PROVIDER_SITE_OTHER): Payer: Medicare Other | Admitting: Sports Medicine

## 2023-09-09 ENCOUNTER — Ambulatory Visit (INDEPENDENT_AMBULATORY_CARE_PROVIDER_SITE_OTHER): Payer: Medicare Other

## 2023-09-09 ENCOUNTER — Emergency Department (HOSPITAL_BASED_OUTPATIENT_CLINIC_OR_DEPARTMENT_OTHER)
Admission: EM | Admit: 2023-09-09 | Discharge: 2023-09-09 | Disposition: A | Discharge status: Home / Self Care | Payer: Medicare Other

## 2023-09-09 ENCOUNTER — Encounter: Payer: Self-pay | Admitting: Sports Medicine

## 2023-09-09 ENCOUNTER — Encounter (HOSPITAL_BASED_OUTPATIENT_CLINIC_OR_DEPARTMENT_OTHER): Payer: Self-pay

## 2023-09-09 ENCOUNTER — Emergency Department (HOSPITAL_BASED_OUTPATIENT_CLINIC_OR_DEPARTMENT_OTHER): Payer: Medicare Other

## 2023-09-09 DIAGNOSIS — Z85038 Personal history of other malignant neoplasm of large intestine: Secondary | ICD-10-CM | POA: Diagnosis not present

## 2023-09-09 DIAGNOSIS — Z853 Personal history of malignant neoplasm of breast: Secondary | ICD-10-CM | POA: Diagnosis not present

## 2023-09-09 DIAGNOSIS — M47816 Spondylosis without myelopathy or radiculopathy, lumbar region: Secondary | ICD-10-CM

## 2023-09-09 DIAGNOSIS — N12 Tubulo-interstitial nephritis, not specified as acute or chronic: Secondary | ICD-10-CM | POA: Diagnosis not present

## 2023-09-09 DIAGNOSIS — R109 Unspecified abdominal pain: Secondary | ICD-10-CM | POA: Diagnosis present

## 2023-09-09 LAB — URINALYSIS, ROUTINE W REFLEX MICROSCOPIC
Bilirubin Urine: NEGATIVE
Glucose, UA: NEGATIVE mg/dL
Ketones, ur: NEGATIVE mg/dL
Nitrite: NEGATIVE
Protein, ur: NEGATIVE mg/dL
Specific Gravity, Urine: 1.02 (ref 1.005–1.030)
pH: 6.5 (ref 5.0–8.0)

## 2023-09-09 LAB — LIPASE, BLOOD: Lipase: 35 U/L (ref 11–51)

## 2023-09-09 LAB — CBC WITH DIFFERENTIAL/PLATELET
Abs Immature Granulocytes: 0.01 10*3/uL (ref 0.00–0.07)
Basophils Absolute: 0 10*3/uL (ref 0.0–0.1)
Basophils Relative: 0 %
Eosinophils Absolute: 0.1 10*3/uL (ref 0.0–0.5)
Eosinophils Relative: 2 %
HCT: 41.7 % (ref 36.0–46.0)
Hemoglobin: 14.2 g/dL (ref 12.0–15.0)
Immature Granulocytes: 0 %
Lymphocytes Relative: 26 %
Lymphs Abs: 1.3 10*3/uL (ref 0.7–4.0)
MCH: 28.6 pg (ref 26.0–34.0)
MCHC: 34.1 g/dL (ref 30.0–36.0)
MCV: 84.1 fL (ref 80.0–100.0)
Monocytes Absolute: 0.3 10*3/uL (ref 0.1–1.0)
Monocytes Relative: 7 %
Neutro Abs: 3.2 10*3/uL (ref 1.7–7.7)
Neutrophils Relative %: 65 %
Platelets: 204 10*3/uL (ref 150–400)
RBC: 4.96 MIL/uL (ref 3.87–5.11)
RDW: 13.3 % (ref 11.5–15.5)
WBC: 4.9 10*3/uL (ref 4.0–10.5)
nRBC: 0 % (ref 0.0–0.2)

## 2023-09-09 LAB — COMPREHENSIVE METABOLIC PANEL
ALT: 21 U/L (ref 0–44)
AST: 27 U/L (ref 15–41)
Albumin: 3.6 g/dL (ref 3.5–5.0)
Alkaline Phosphatase: 71 U/L (ref 38–126)
Anion gap: 9 (ref 5–15)
BUN: 17 mg/dL (ref 8–23)
CO2: 24 mmol/L (ref 22–32)
Calcium: 9 mg/dL (ref 8.9–10.3)
Chloride: 108 mmol/L (ref 98–111)
Creatinine, Ser: 0.87 mg/dL (ref 0.44–1.00)
GFR, Estimated: >60 mL/min (ref >60)
Glucose, Bld: 123 mg/dL — ABNORMAL HIGH (ref 70–99)
Potassium: 3.6 mmol/L (ref 3.5–5.1)
Sodium: 141 mmol/L (ref 135–145)
Total Bilirubin: 0.5 mg/dL (ref 0.3–1.2)
Total Protein: 6.8 g/dL (ref 6.5–8.1)

## 2023-09-09 LAB — URINALYSIS, MICROSCOPIC (REFLEX): RBC / HPF: NONE SEEN RBC/hpf (ref 0–5)

## 2023-09-09 MED ORDER — MORPHINE SULFATE (PF) 2 MG/ML IV SOLN: 2.0000 mg | Freq: Once | INTRAVENOUS | Status: DC

## 2023-09-09 MED ORDER — CEPHALEXIN 250 MG PO CAPS
500.0000 mg | ORAL_CAPSULE | Freq: Once | ORAL | Status: AC
  Administered 2023-09-09: 500 mg via ORAL
  Filled 2023-09-09: qty 2

## 2023-09-09 MED ORDER — CEPHALEXIN 500 MG PO CAPS: 500.0000 mg | ORAL_CAPSULE | Freq: Four times a day (QID) | ORAL | 0 refills | Status: DC

## 2023-09-09 MED ORDER — IOHEXOL 300 MG/ML  SOLN
100.0000 mL | Freq: Once | INTRAMUSCULAR | Status: AC | PRN
  Administered 2023-09-09: 100 mL via INTRAVENOUS

## 2023-09-09 MED ORDER — ONDANSETRON HCL 4 MG/2ML IJ SOLN: 4.0000 mg | Freq: Once | INTRAMUSCULAR | Status: DC

## 2023-09-09 NOTE — Progress Notes (Signed)
    Procedures performed today:    None.  Independent interpretation of notes and tests performed by another provider:   None.  Brief History, Exam, Impression, and Recommendations:    Lumbar spondylosis Pleasant 73 year old female, increasing axial low back pain right sided quadratus lumborum, recently diagnosed with pyonephritis, she was treated with antibiotics without much improvement. She has had epidurals, the last of which was several months ago. Repeat right L4-L5 interlaminar epidural, return to see me 6 weeks after.    ____________________________________________ Ihor Austin. Benjamin Stain, M.D., ABFM., CAQSM., AME. Primary Care and Sports Medicine [facility]  Adjunct Professor of Family Medicine  [employer]  Restaurant manager, fast food

## 2023-09-09 NOTE — Assessment & Plan Note (Addendum)
Pleasant 73 year old female, increasing axial low back pain right sided quadratus lumborum, recently diagnosed with pyonephritis, she was treated with antibiotics without much improvement. She has had epidurals, the last of which was several months ago. Repeat right L4-L5 interlaminar epidural, return to see me 6 weeks after.

## 2023-09-09 NOTE — Discharge Instructions (Signed)
Your workup overall was reassuring.  Your urinalysis does show some findings consistent with potential infection.  Please take the antibiotics as prescribed and follow-up with your doctor.  You may want to follow-up with your orthopedist as well.  Take Tylenol as needed for pain.  You may also want to try to pick up some lidocaine patches over-the-counter to try.  Return to the emergency department for worsening symptoms.

## 2023-09-09 NOTE — ED Triage Notes (Signed)
Pt reports right sided back pain that began yesterday. Pain worse with movement. Described as dull ache. No injury reported

## 2023-09-09 NOTE — ED Provider Notes (Signed)
[facility] Provider Note   CSN: [id number] Arrival date & time: 09/09/23  4098     History  Chief Complaint  Patient presents with   Back Pain    Amanda Dickson is a 73 y.o. female.  73 year old female with past medical history of chronic back pain as well as breast cancer and colon cancer in remission presenting to the emergency department today with right-sided flank pain.  The patient states this began yesterday while she was at church.  The patient reports the pain was persistent all day yesterday.  This did improve overnight.  She states she woke up this morning and it was actually worse.  She came to the ER that time for further evaluation.  The patient said normal bowel movements.  Denies any associated urinary symptoms.  She denies any history of kidney stones.  She came to the ER today due to ongoing/worsening symptoms.  He denies any midline back pain.  Denies any bowel or bladder dysfunction or saddle anesthesia.  The history is provided by the patient.  Back Pain      Home Medications Prior to Admission medications   Medication Sig Start Date End Date Taking? Authorizing Provider  cephALEXin (KEFLEX) 500 MG capsule Take 1 capsule (500 mg total) by mouth 4 (four) times daily. 09/09/23  Yes Durwin Glaze, MD  BIOTIN PO Place 1 tablet under the tongue daily at 6 (six) AM.    [provider]  celecoxib (CELEBREX) 200 MG capsule One to 2 tablets by mouth daily as needed for pain. 04/29/23   Monica Becton, MD  cholecalciferol (VITAMIN D3) 25 MCG (1000 UNIT) tablet Take 1,000 Units by mouth daily.    [provider]  Cyanocobalamin (VITAMIN B-12) 5000 MCG SUBL Take 5,000 mcg by mouth daily at 6 (six) AM. 01/24/18   [provider]  diphenhydrAMINE (BENADRYL) 25 mg capsule Take 25 mg by mouth at bedtime as needed.    [provider]  Fiber Adult Gummies 2 g CHEW Chew 2 each by mouth daily.     [provider]  levothyroxine (SYNTHROID) 25 MCG tablet Take in addition to tablet once per week. 02/25/23   Everrett Coombe, DO  levothyroxine (SYNTHROID) 50 MCG tablet Take 1 tablet (50 mcg total) by mouth daily. 11/30/22   Everrett Coombe, DO  methocarbamol (ROBAXIN) 500 MG tablet Take 1 tablet (500 mg total) by mouth 3 (three) times daily. 08/19/23   Monica Becton, MD  Multiple Vitamins-Minerals (MULTI-VITAMIN GUMMIES) CHEW Chew 2 tablets by mouth daily.    [provider]      Allergies    Meperidine hcl    Review of Systems   Review of Systems  Musculoskeletal:  Positive for back pain.  All other systems reviewed and are negative.   Physical Exam Updated Vital Signs BP (!) 153/66   Pulse 60   Temp 97.6 F (36.4 C)   Resp 16   Wt 85.4 kg   SpO2 95%   BMI 31.32 kg/m  Physical Exam Vitals and nursing note reviewed.   Gen: NAD Eyes: PERRL, EOMI HEENT: no oropharyngeal swelling Neck: trachea midline Resp: clear to auscultation bilaterally Card: RRR, no murmurs, rubs, or gallops Abd: R sided CVA tenderness Extremities: no calf tenderness, no edema Vascular: 2+ radial pulses bilaterally, 2+ DP pulses bilaterally Neuro: no focal deficits Skin: no vesicular rashes noted Psyc: acting appropriately   ED Results / Procedures /  Treatments   Labs (all labs ordered are listed, but only abnormal results are displayed) Labs Reviewed  COMPREHENSIVE METABOLIC PANEL - Abnormal; Notable for the following components:      Result Value   Glucose, Bld 123 (*)    All other components within normal limits  URINALYSIS, ROUTINE W REFLEX MICROSCOPIC - Abnormal; Notable for the following components:   Hgb urine dipstick TRACE (*)    Leukocytes,Ua TRACE (*)    All other components within normal limits  URINALYSIS, MICROSCOPIC (REFLEX) - Abnormal; Notable for the following components:   Bacteria, UA RARE (*)    All other components within normal limits   CBC WITH DIFFERENTIAL/PLATELET  LIPASE, BLOOD    EKG None  Radiology CT ABDOMEN PELVIS W CONTRAST  Result Date: 09/09/2023 CLINICAL DATA:  Abdominal pain, acute, nonlocalized Flank, abd pain. Right-sided back pain. EXAM: CT ABDOMEN AND PELVIS WITH CONTRAST TECHNIQUE: Multidetector CT imaging of the abdomen and pelvis was performed using the standard protocol following bolus administration of intravenous contrast. RADIATION DOSE REDUCTION: This exam was performed according to the departmental dose-optimization program which includes automated exposure control, adjustment of the mA and/or kV according to patient size and/or use of iterative reconstruction technique. CONTRAST:  OMNIPAQUE IOHEXOL 300 MG/ML  SOLN COMPARISON:  None Available. FINDINGS: Lower chest: No acute abnormality. Hepatobiliary: No focal liver abnormality is seen. Status post cholecystectomy. No biliary dilatation. Pancreas: Unremarkable. No pancreatic ductal dilatation or surrounding inflammatory changes. Spleen: Normal. Adrenals/Urinary Tract: Adrenal glands are unremarkable. Kidneys are normal, without renal calculi, focal lesion, or hydronephrosis. Bladder is unremarkable. Stomach/Bowel: Normal stomach and duodenum. No dilated loops of small bowel. Postoperative changes of prior ileocecectomy. No bowel obstruction. Colon is otherwise unremarkable. No bowel wall thickening or surrounding inflammation. Vascular/Lymphatic: No significant vascular findings are present. No enlarged abdominal or pelvic lymph nodes. Reproductive: Uterine fibroids.  No adnexal masses. Other: No abdominal wall hernia or abnormality. No abdominopelvic ascites. Musculoskeletal: Multilevel lumbar spondylosis without high-grade spinal canal stenosis. IMPRESSION: 1. No acute abdominopelvic process. 2. Uterine fibroids. 3. Multilevel lumbar spondylosis without high-grade spinal canal stenosis. Electronically Signed   By: Orvan Falconer M.D.   On: 09/09/2023  10:55    Procedures Procedures    Medications Ordered in ED Medications  morphine (PF) 2 MG/ML injection 2 mg (2 mg Intravenous Not Given 09/09/23 0947)  ondansetron (ZOFRAN) injection 4 mg (4 mg Intravenous Not Given 09/09/23 0947)  cephALEXin (KEFLEX) capsule 500 mg (has no administration in time range)  iohexol (OMNIPAQUE) 300 MG/ML solution 100 mL (100 mLs Intravenous Contrast Given 09/09/23 1014)    ED Course/ Medical Decision Making/ A&P                                 Medical Decision Making 73 year old female with past medical history of breast cancer and colon cancer in remission presenting to the emergency department today with right-sided flank pain.  I will further evaluate patient here with basic labs including LFTs and lipase to evaluate for hepatobiliary pathology or pancreatitis.  I will obtain CT scan of her abdomen to evaluate for ureterolithiasis, diverticulitis, colitis, malignancy, or other intra-abdominal pathology.  I will give patient morphine Zofran for symptoms.  Also obtain urinalysis to evaluate for pyelonephritis.  I will reevaluate for ultimate disposition.  She does not have any red flag symptoms for cauda equina syndrome or cord compressing lesion at this time.  The patient's  urinalysis does show some leukocytes with rare bacteria.  There is also some microscopic hematuria.  Will treat the patient with Keflex.  She is otherwise well-appearing with stable vital signs.  Think she is stable for discharge.  Amount and/or Complexity of Data Reviewed Labs: ordered. Radiology: ordered.  Risk Prescription drug management.           Final Clinical Impression(s) / ED Diagnoses Final diagnoses:  Pyelonephritis  Disposition: discharge  Rx / DC Orders ED Discharge Orders          Ordered    cephALEXin (KEFLEX) 500 MG capsule  4 times daily        09/09/23 1115              Durwin Glaze, MD 09/09/23 1116

## 2023-09-11 ENCOUNTER — Encounter: Payer: Self-pay | Admitting: Sports Medicine

## 2023-09-11 NOTE — Discharge Instructions (Signed)

## 2023-09-12 ENCOUNTER — Ambulatory Visit
Admission: RE | Admit: 2023-09-12 | Discharge: 2023-09-12 | Disposition: A | Discharge status: Home / Self Care | Payer: Medicare Other | Source: Ambulatory Visit | Attending: Sports Medicine | Admitting: Sports Medicine

## 2023-09-12 DIAGNOSIS — M47816 Spondylosis without myelopathy or radiculopathy, lumbar region: Secondary | ICD-10-CM

## 2023-09-12 MED ORDER — METHYLPREDNISOLONE ACETATE 40 MG/ML INJ SUSP (RADIOLOG
80.0000 mg | Freq: Once | INTRAMUSCULAR | Status: AC
  Administered 2023-09-12: 80 mg via EPIDURAL

## 2023-09-12 MED ORDER — IOPAMIDOL (ISOVUE-M 200) INJECTION 41%
1.0000 mL | Freq: Once | INTRAMUSCULAR | Status: AC
  Administered 2023-09-12: 1 mL via EPIDURAL

## 2023-09-18 ENCOUNTER — Encounter: Payer: Self-pay | Admitting: Family Medicine

## 2023-09-18 ENCOUNTER — Ambulatory Visit (INDEPENDENT_AMBULATORY_CARE_PROVIDER_SITE_OTHER): Payer: Medicare Other | Admitting: Family Medicine

## 2023-09-18 VITALS — BP 144/92 | HR 63 | Temp 97.9°F | Ht 65.0 in | Wt 190.0 lb

## 2023-09-18 DIAGNOSIS — G4700 Insomnia, unspecified: Secondary | ICD-10-CM | POA: Diagnosis not present

## 2023-09-18 DIAGNOSIS — I10 Essential (primary) hypertension: Secondary | ICD-10-CM | POA: Diagnosis not present

## 2023-09-18 DIAGNOSIS — R040 Epistaxis: Secondary | ICD-10-CM | POA: Diagnosis not present

## 2023-09-18 DIAGNOSIS — R232 Flushing: Secondary | ICD-10-CM | POA: Diagnosis not present

## 2023-09-18 HISTORY — DX: Insomnia, unspecified: G47.00

## 2023-09-18 HISTORY — DX: Flushing: R23.2

## 2023-09-18 HISTORY — DX: Essential (primary) hypertension: I10

## 2023-09-18 MED ORDER — LEVOTHYROXINE SODIUM 25 MCG PO TABS: ORAL_TABLET | ORAL | 1 refills | Status: DC

## 2023-09-18 MED ORDER — NORTRIPTYLINE HCL 10 MG PO CAPS
10.0000 mg | ORAL_CAPSULE | Freq: Every day | ORAL | 1 refills | Status: DC
Start: 2023-09-18 — End: 2023-10-18

## 2023-09-18 MED ORDER — LEVOTHYROXINE SODIUM 50 MCG PO TABS: 50.0000 ug | ORAL_TABLET | Freq: Every day | ORAL | 3 refills | Status: DC

## 2023-09-18 NOTE — Patient Instructions (Addendum)
Keep the diet clean and stay active.  Please find out when your pneumonia vaccine(s) were and what was the name of the vaccine.   Avoid digital trauma (picking your nose).  Use an air humidifier at night at least.   Use triple antibiotic ointment like Neosporin twice daily. Use Vaseline/Ayr Gel/NasoGel in between.  If you have a nosebleed, apply direct pressure for 10 minutes. If a bleed lasts longer than 15-20 minutes, go to the ER.  Foods that may reduce pain: 1) Ginger 2) Blueberries 3) Salmon 4) Pumpkin seeds 5) dark chocolate 6) turmeric 7) tart cherries 8) virgin olive oil 9) chilli peppers 10) mint 11) krill oil   Consider use of Afrin spray or ice during a nosebleed to help.  Healthy Eating Plan Many factors influence your heart health, including eating and exercise habits. Heart (coronary) risk increases with abnormal blood fat (lipid) levels. Heart-healthy meal planning includes limiting unhealthy fats, increasing healthy fats, and making other small dietary changes. This includes maintaining a healthy body weight to help keep lipid levels within a normal range.  WHAT IS MY PLAN?  Your health care provider recommends that you: Drink a glass of water before meals to help with satiety. Eat slowly. An alternative to the water is to add Metamucil. This will help with satiety as well. It does contain calories, unlike water.  WHAT TYPES OF FAT SHOULD I CHOOSE? Choose healthy fats more often. Choose monounsaturated and polyunsaturated fats, such as olive oil and canola oil, flaxseeds, walnuts, almonds, and seeds. Eat more omega-3 fats. Good choices include salmon, mackerel, sardines, tuna, flaxseed oil, and ground flaxseeds. Aim to eat fish at least two times each week. Avoid foods with partially hydrogenated oils in them. These contain trans fats. Examples of foods that contain trans fats are stick margarine, some tub margarines, cookies, crackers, and other baked goods. If  you are going to avoid a fat, this is the one to avoid!  WHAT GENERAL GUIDELINES DO I NEED TO FOLLOW? Check food labels carefully to identify foods with trans fats. Avoid these types of options when possible. Fill one half of your plate with vegetables and green salads. Eat 4-5 servings of vegetables per day. A serving of vegetables equals 1 cup of raw leafy vegetables,  cup of raw or cooked cut-up vegetables, or  cup of vegetable juice. Fill one fourth of your plate with whole grains. Look for the word "whole" as the first word in the ingredient list. Fill one fourth of your plate with lean protein foods. Eat 4-5 servings of fruit per day. A serving of fruit equals one medium whole fruit,  cup of dried fruit,  cup of fresh, frozen, or canned fruit. Try to avoid fruits in cups/syrups as the sugar content can be high. Eat more foods that contain soluble fiber. Examples of foods that contain this type of fiber are apples, broccoli, carrots, beans, peas, and barley. Aim to get 20-30 g of fiber per day. Eat more home-cooked food and less restaurant, buffet, and fast food. Limit or avoid alcohol. Limit foods that are high in starch and sugar. Avoid fried foods when able. Cook foods by using methods other than frying. Baking, boiling, grilling, and broiling are all great options. Other fat-reducing suggestions include: Removing the skin from poultry. Removing all visible fats from meats. Skimming the fat off of stews, soups, and gravies before serving them. Steaming vegetables in water or broth. Lose weight if you are overweight. Losing just 5-10%  of your initial body weight can help your overall health and prevent diseases such as diabetes and heart disease. Increase your consumption of nuts, legumes, and seeds to 4-5 servings per week. One serving of dried beans or legumes equals  cup after being cooked, one serving of nuts equals 1 ounces, and one serving of seeds equals  ounce or 1  tablespoon.  WHAT ARE GOOD FOODS CAN I EAT? Grains Grainy breads (try to find bread that is 3 g of fiber per slice or greater), oatmeal, light popcorn. Whole-grain cereals. Rice and pasta, including brown rice and those that are made with whole wheat. Edamame pasta is a great alternative to grain pasta. It has a higher protein content. Try to avoid significant consumption of white bread, sugary cereals, or pastries/baked goods.  Vegetables All vegetables. Cooked white potatoes do not count as vegetables.  Fruits All fruits, but limit pineapple and bananas as these fruits have a higher sugar content.  Meats and Other Protein Sources Lean, well-trimmed beef, veal, pork, and lamb. Chicken and [place] without skin. All fish and shellfish. Wild duck, rabbit, pheasant, and venison. Egg whites or low-cholesterol egg substitutes. Dried beans, peas, lentils, and tofu. Seeds and most nuts.  Dairy Low-fat or nonfat cheeses, including ricotta, string, and mozzarella. Skim or 1% milk that is liquid, powdered, or evaporated. Buttermilk that is made with low-fat milk. Nonfat or low-fat yogurt. Soy/Almond milk are good alternatives if you cannot handle dairy.  Beverages Water is the best for you. Sports drinks with less sugar are more desirable unless you are a highly active athlete.  Sweets and Desserts Sherbets and fruit ices. Honey, jam, marmalade, jelly, and syrups. Dark chocolate.  Eat all sweets and desserts in moderation.  Fats and Oils Nonhydrogenated (trans-free) margarines. Vegetable oils, including soybean, sesame, sunflower, olive, peanut, safflower, corn, canola, and cottonseed. Salad dressings or mayonnaise that are made with a vegetable oil. Limit added fats and oils that you use for cooking, baking, salads, and as spreads.  Other Cocoa powder. Coffee and tea. Most condiments.  The items listed above may not be a complete list of recommended foods or beverages. Contact your dietitian for  more options.

## 2023-09-18 NOTE — Progress Notes (Signed)
Chief Complaint  Patient presents with   New Patient (Initial Visit)    Insomnia        New Patient Visit SUBJECTIVE: HPI: Amanda Dickson is an 73 y.o.female who is being seen for establishing care.  The patient was previously seen at another [facility] office.  High Blood Pressure Patient presents for elevated blood pressure.  She does monitor home blood pressures. Blood pressures ranging on average from 130-140's/80-90's. She is not on any medication.  She is not always adhering to a healthy diet overall. Exercise: walking No CP or SOB.   Insomnia For 2 yrs, has been having trouble staying asleep. Benadryl helps but it makes her feel groggy in the AM. Does think about things, does not describe any stress or anxiety. Does not snore at night. Gets around 6 hrs per night. Does not feel well rested. Does not remember what medication she was on before.   Hot flashes Started over past year, getting more freq. Happens at night and during the day. No temperature abnormalities. Happened after she went on tx for breast cancer. Lasts for 5-10 minutes. Happens daily, roughly 6-10x/d. Has not tried anything OTC.   Past Medical History:  Diagnosis Date   Allergy    Arthritis Long ago!!   Breast cancer [facility]) 2013   Right, s/p lumpectomy with brachytherapy   Colon cancer [facility]) 2021   Essential hypertension 09/18/2023   Hypothyroidism    Past Surgical History:  Procedure Laterality Date   APPENDECTOMY  2021   part of colon removal   BREAST SURGERY  2013   Rt. Lumpectomy   CHOLECYSTECTOMY     COLON SURGERY  09-12-2020   COLONOSCOPY  09/06/2020   Dr. Desmond Lope - [facility]. Moderate diverticulosis of the sigmoid colon, Mass (3cm) in the proximal ascending colon.   COLONOSCOPY  10/16/2010   Dr. Desmond Lope - [facility]. Moderate diverticulosis sigmoid colon, grade 1 internal hemorrhoids   HERNIA REPAIR     TONSILLECTOMY     TUBAL LIGATION     Family  History  Problem Relation Age of Onset   Melanoma Mother    Arthritis Mother    Cancer Mother    Vision loss Mother    Colon cancer Father    Prostate cancer Father    Liver cancer Father    Cancer Father    Colon cancer Maternal Grandfather    Cancer Maternal Grandfather    Crohn's disease Maternal Aunt        Ulcerative colitis   Cancer Brother    Rectal cancer Neg Hx    Esophageal cancer Neg Hx    Pancreatic cancer Neg Hx    Stomach cancer Neg Hx    Allergies  Allergen Reactions   Meperidine Hcl     Paralysis feelings    Current Outpatient Medications:    celecoxib (CELEBREX) 200 MG capsule, One to 2 tablets by mouth daily as needed for pain., Disp: 180 capsule, Rfl: 3   cholecalciferol (VITAMIN D3) 25 MCG (1000 UNIT) tablet, Take 1,000 Units by mouth daily., Disp: , Rfl:    Cyanocobalamin (VITAMIN B-12) 5000 MCG SUBL, Take 5,000 mcg by mouth daily at 6 (six) AM., Disp: , Rfl:    Fiber Adult Gummies 2 g CHEW, Chew 2 each by mouth daily., Disp: , Rfl:    levothyroxine (SYNTHROID) 25 MCG tablet, Take in addition to tablet once per week., Disp: 30 tablet, Rfl: 1   levothyroxine (SYNTHROID) 50  MCG tablet, Take 1 tablet (50 mcg total) by mouth daily., Disp: 90 tablet, Rfl: 3   methocarbamol (ROBAXIN) 500 MG tablet, Take 1 tablet (500 mg total) by mouth 3 (three) times daily., Disp: 90 tablet, Rfl: 0   Multiple Vitamins-Minerals (MULTI-VITAMIN GUMMIES) CHEW, Chew 2 tablets by mouth daily., Disp: , Rfl:    nortriptyline (PAMELOR) 10 MG capsule, Take 1 capsule (10 mg total) by mouth at bedtime., Disp: 30 capsule, Rfl: 1  OBJECTIVE: BP (!) 144/92 (BP Location: Left Arm, Cuff Size: Normal)   Pulse 63   Temp 97.9 F (36.6 C) (Oral)   Ht 5\' 5"  (1.651 m)   Wt 190 lb (86.2 kg)   SpO2 98%   BMI 31.62 kg/m  General:  well developed, well nourished, in no apparent distress Skin:  no significant moles, warts, or growths Nose:  nares patent, septum midline, mucosa  normal Throat/Pharynx:  lips and gingiva without lesion; tongue and uvula midline; non-inflamed pharynx; no exudates or postnasal drainage Lungs:  clear to auscultation, breath sounds equal bilaterally, no respiratory distress Cardio:  regular rate and rhythm, no LE edema or bruits Musculoskeletal:  symmetrical muscle groups noted without atrophy or deformity Neuro:  gait normal Psych: well oriented with normal range of affect and appropriate judgment/insight  ASSESSMENT/PLAN: Essential hypertension  Insomnia, unspecified type - Plan: nortriptyline (PAMELOR) 10 MG capsule  Hot flashes - Plan: nortriptyline (PAMELOR) 10 MG capsule  Epistaxis  Patient instructed to sign release of records form from their previous PCP. Chronic, unstable. Opting for lifestyle mods first. Monitor BP at ome. Counseled on diet/exercise. Diet handout provided.  Chronic, uncontrolled. Sleep hygiene provided. Nortriptyline 10 mg qhs starting. Chronic, uncontrolled. As above. Recommended using an air humidifier at night, Neosporin twice daily, Vaseline in between applications, avoid digital trauma, warning signs and symptoms verbalized. Patient should return in 1 mo. The patient voiced understanding and agreement to the plan.   Jilda Roche [place], DO 09/18/23  10:25 AM

## 2023-09-24 ENCOUNTER — Ambulatory Visit: Payer: Medicare Other | Admitting: Gastroenterology

## 2023-10-18 ENCOUNTER — Ambulatory Visit: Payer: Medicare Other | Admitting: Family Medicine

## 2023-10-18 ENCOUNTER — Encounter: Payer: Self-pay | Admitting: Family Medicine

## 2023-10-18 VITALS — BP 134/84 | HR 81 | Temp 98.0°F | Ht 65.0 in | Wt 193.2 lb

## 2023-10-18 DIAGNOSIS — R232 Flushing: Secondary | ICD-10-CM | POA: Diagnosis not present

## 2023-10-18 DIAGNOSIS — G4700 Insomnia, unspecified: Secondary | ICD-10-CM

## 2023-10-18 DIAGNOSIS — I10 Essential (primary) hypertension: Secondary | ICD-10-CM

## 2023-10-18 MED ORDER — PREGABALIN 25 MG PO CAPS: 25.0000 mg | ORAL_CAPSULE | Freq: Two times a day (BID) | ORAL | 0 refills | Status: DC

## 2023-10-18 NOTE — Patient Instructions (Addendum)
Take the Lyrica at night first as it can make you drowsy.   Keep monitoring your BP at home.   Keep the diet clean and stay active.  Let us know if you need anything.

## 2023-10-18 NOTE — Progress Notes (Signed)
Chief Complaint  Patient presents with   Follow-up    Subjective Amanda Dickson is a 73 y.o. female who presents for elevated BP follow up. She does monitor home blood pressures. Blood pressures ranging from 170's/80's on average. She is not on any medication.  She is usually adhering to a healthy diet overall. Current exercise: body wt exercises, some walking No CP or SOB.   Insomnia/hot flashes Started on nortriptyline 10 mg qhs. Noticed no improvement. No AE's. Gabapentin was not helpful. Venlafaxine was not helpful either. She has a hx of breast cancer so has not taken soy.    Past Medical History:  Diagnosis Date   Allergy    Arthritis Long ago!!   Breast cancer [facility]) 2013   Right, s/p lumpectomy with brachytherapy   Colon cancer ([facility]) 2021   Essential hypertension 09/18/2023   Hypothyroidism     Exam BP 134/84 (BP Location: Left Arm, Cuff Size: Large)   Pulse 81   Temp 98 F (36.7 C) (Oral)   Ht 5\' 5"  (1.651 m)   Wt 193 lb 4 oz (87.7 kg)   SpO2 94%   BMI 32.16 kg/m  General:  well developed, well nourished, in no apparent distress Heart: RRR, no bruits, no LE edema Lungs: clear to auscultation, no accessory muscle use Psych: well oriented with normal range of affect and appropriate judgment/insight  Insomnia, unspecified type - Plan: pregabalin (LYRICA) 25 MG capsule  Hot flashes - Plan: pregabalin (LYRICA) 25 MG capsule  Essential hypertension  Chronic, uncontrolled.  Start Lyrica 25 mg 1-2 times daily.   Chronic, unstable.  Lyrica 25 mg twice daily as above.  She has failed gabapentin, nortriptyline, Effexor.  Could consider Paxil, black cohosh, or hormonal treatment. Continue to monitor blood pressure at home.  Medication for now.  Bring monitor to the next visit.  Counseled on diet and exercise. F/u in 1 mo. The patient voiced understanding and agreement to the plan.  Jilda Roche [place], DO 10/18/23  9:37 AM

## 2023-10-22 ENCOUNTER — Encounter: Payer: Self-pay | Admitting: Family Medicine

## 2023-10-24 ENCOUNTER — Encounter: Payer: Self-pay | Admitting: Sports Medicine

## 2023-10-24 ENCOUNTER — Ambulatory Visit (INDEPENDENT_AMBULATORY_CARE_PROVIDER_SITE_OTHER): Payer: Medicare Other | Admitting: Sports Medicine

## 2023-10-24 DIAGNOSIS — R232 Flushing: Secondary | ICD-10-CM

## 2023-10-24 DIAGNOSIS — M47816 Spondylosis without myelopathy or radiculopathy, lumbar region: Secondary | ICD-10-CM | POA: Diagnosis not present

## 2023-10-24 NOTE — Patient Instructions (Addendum)
Look into Veozah, typically best priced when sent to [facility] order pharmacy

## 2023-10-24 NOTE — Assessment & Plan Note (Signed)
We did discuss various treatments for her vasomotor instability, she is having only meager efficacy with pregabalin, hormone replacement is contraindicated due to her breast cancer, we did discuss Veozah which is a new agent, she can discuss this with her PCP.

## 2023-10-24 NOTE — Assessment & Plan Note (Signed)
This very pleasant 73 year old female has chronic axial low back pain with right sided quadratus lumborum pain, she does have known lumbar DDD, she had epidurals, the last of which was many months ago, we have repeated a right L4-L5 interlaminar epidural that she did extremely well. We can do this again as needed, return to see me as needed.

## 2023-10-24 NOTE — Progress Notes (Signed)
    Procedures performed today:    None.  Independent interpretation of notes and tests performed by another provider:   None.  Brief History, Exam, Impression, and Recommendations:    Lumbar spondylosis This very pleasant 73 year old female has chronic axial low back pain with right sided quadratus lumborum pain, she does have known lumbar DDD, she had epidurals, the last of which was many months ago, we have repeated a right L4-L5 interlaminar epidural that she did extremely well. We can do this again as needed, return to see me as needed.  Hot flashes We did discuss various treatments for her vasomotor instability, she is having only meager efficacy with pregabalin, hormone replacement is contraindicated due to her breast cancer, we did discuss Veozah which is a new agent, she can discuss this with her PCP.    ____________________________________________ Ihor Austin. Benjamin Stain, M.D., ABFM., CAQSM., AME. Primary Care and Sports Medicine [facility]  Adjunct Professor of Family Medicine  [place] [employer]  Restaurant manager, fast food

## 2023-11-01 ENCOUNTER — Inpatient Hospital Stay: Payer: Medicare Other | Attending: Oncology | Admitting: Oncology

## 2023-11-01 VITALS — BP 133/74 | HR 76 | Temp 98.1°F | Resp 18 | Ht 65.0 in | Wt 190.4 lb

## 2023-11-01 DIAGNOSIS — Z170 Estrogen receptor positive status [ER+]: Secondary | ICD-10-CM | POA: Diagnosis not present

## 2023-11-01 DIAGNOSIS — Z853 Personal history of malignant neoplasm of breast: Secondary | ICD-10-CM | POA: Diagnosis not present

## 2023-11-01 DIAGNOSIS — C50411 Malignant neoplasm of upper-outer quadrant of right female breast: Secondary | ICD-10-CM

## 2023-11-01 DIAGNOSIS — Z9049 Acquired absence of other specified parts of digestive tract: Secondary | ICD-10-CM | POA: Insufficient documentation

## 2023-11-01 DIAGNOSIS — Z85038 Personal history of other malignant neoplasm of large intestine: Secondary | ICD-10-CM | POA: Insufficient documentation

## 2023-11-01 DIAGNOSIS — Z1231 Encounter for screening mammogram for malignant neoplasm of breast: Secondary | ICD-10-CM | POA: Diagnosis not present

## 2023-11-01 DIAGNOSIS — Z08 Encounter for follow-up examination after completed treatment for malignant neoplasm: Secondary | ICD-10-CM | POA: Insufficient documentation

## 2023-11-01 NOTE — Progress Notes (Signed)
  [facility] OFFICE PROGRESS NOTE   Diagnosis: Breast cancer, colon cancer  INTERVAL HISTORY:   Amanda Dickson returns as scheduled.  She reports irregular bowel habits.  No bleeding.  Good appetite.  No change over either breast.  She has developed a rash inferior to the left breast.  A bilateral mammogram 07/01/2023 revealed no evidence of malignancy.  Objective:  Vital signs in last 24 hours:  Blood pressure 133/74, pulse 76, temperature 98.1 F (36.7 C), temperature source Temporal, resp. rate 18, height 5\' 5"  (1.651 m), weight 190 lb 6.4 oz (86.4 kg), SpO2 98%.    Lymphatics: No cervical, supraclavicular, axillary, or inguinal nodes Resp: Lungs clear bilaterally Cardio: Rate and rhythm GI: No mass, no hepatosplenomegaly Vascular: No leg edema Breast: No mass in either breast.  Right lumpectomy.  Firm tissue surrounding the lumpectomy scar.  1-1.5 cm cutaneous oval mobile lesion at the 3 o'clock position of the medial right breast Skin: Few tiny papules/skin tags at the left breast/chest wall skin fold  Lab Results:  Lab Results  Component Value Date   WBC 4.9 09/09/2023   HGB 14.2 09/09/2023   HCT 41.7 09/09/2023   MCV 84.1 09/09/2023   PLT 204 09/09/2023   NEUTROABS 3.2 09/09/2023    CMP  Lab Results  Component Value Date   NA 141 09/09/2023   K 3.6 09/09/2023   CL 108 09/09/2023   CO2 24 09/09/2023   GLUCOSE 123 (H) 09/09/2023   BUN 17 09/09/2023   CREATININE 0.87 09/09/2023   CALCIUM 9.0 09/09/2023   PROT 6.8 09/09/2023   ALBUMIN 3.6 09/09/2023   AST 27 09/09/2023   ALT 21 09/09/2023   ALKPHOS 71 09/09/2023   BILITOT 0.5 09/09/2023   GFRNONAA >60 09/09/2023    Medications: I have reviewed the patient's current medications.   Assessment/Plan: Right breast cancer-stage Ia (T1N0), status post a right lumpectomy and sentinel lymph node biopsy March 2013 ER 100%, PR 99%, HER2 0 Adjuvant brachytherapy March 2013 Adjuvant aromatase  inhibitors including Femara, Aromasin, and Arimidex March 2013-March 2015-discontinued secondary to severe hot flashes and rash, trial of tamoxifen discontinued secondary to severe hot flashes Right colon cancer, stage I, T1N0-status post a right colectomy 09/12/2020, tumor invaded the submucosa, 0/12 lymph nodes, no lymphovascular perineural invasion, mismatch repair protein proficient CT abdomen/pelvis negative for metastases Colonoscopy 09/22/2021-negative for polyps  3.   Negative INVITAE genetic panel 4.   Hypothyroidism 5.   G2 P2     Disposition: Amanda Dickson is in clinical remission from colon cancer and breast cancer.  She will continue colonoscopy surveillance with Dr. Barron Alvine.  She is scheduled to see Dr. Barron Alvine to evaluate the irregular bowel habits.  She will continue yearly mammography.  She will return for an office visit in 1 year.  Thornton Papas, MD  11/01/2023  9:23 AM

## 2023-11-04 ENCOUNTER — Encounter: Payer: Self-pay | Admitting: Family Medicine

## 2023-11-07 ENCOUNTER — Ambulatory Visit: Payer: Medicare Other

## 2023-11-07 VITALS — Ht 65.0 in | Wt 190.0 lb

## 2023-11-07 DIAGNOSIS — Z0000 Encounter for general adult medical examination without abnormal findings: Secondary | ICD-10-CM

## 2023-11-07 NOTE — Patient Instructions (Addendum)
Ms. Neary , Thank you for taking time to come for your Medicare Wellness Visit. I appreciate your ongoing commitment to your health goals. Please review the following plan we discussed and let me know if I can assist you in the future.   Referrals/Orders/Follow-Ups/Clinician Recommendations:    This is a list of the screening recommended for you and due dates:  Health Maintenance  Topic Date Due   Pneumonia Vaccine (1 of 1 - PCV) 11/24/2013   Hepatitis C Screening  06/04/2024*   COVID-19 Vaccine (4 - 2023-24 season) 11/26/2023   Colon Cancer Screening  09/22/2024   Medicare Annual Wellness Visit  11/06/2024   Mammogram  06/30/2025   DTaP/Tdap/Td vaccine (2 - Td or Tdap) 08/01/2032   Flu Shot  Completed   DEXA scan (bone density measurement)  Completed   Zoster (Shingles) Vaccine  Completed   HPV Vaccine  Aged Out  *Topic was postponed. The date shown is not the original due date.    Advanced directives: (Copy Requested) Please bring a copy of your health care power of attorney and living will to the office to be added to your chart at your convenience.  Next Medicare Annual Wellness Visit scheduled for next year: Yes

## 2023-11-07 NOTE — Progress Notes (Addendum)
Subjective:   Amanda Dickson is a 73 y.o. female who presents for Medicare Annual (Subsequent) preventive examination.  Visit Complete: Virtual I connected with  Amanda Dickson on 11/07/23 by a audio enabled telemedicine application and verified that I am speaking with the correct person using two identifiers.  Patient Location: Home  Provider Location: Home Office  I discussed the limitations of evaluation and management by telemedicine. The patient expressed understanding and agreed to proceed.  Vital Signs: Because this visit was a virtual/telehealth visit, some criteria may be missing or patient reported. Any vitals not documented were not able to be obtained and vitals that have been documented are patient reported.  Patient Medicare AWV questionnaire was completed by the patient on 10/31/23; I have confirmed that all information answered by patient is correct and no changes since this date.  Cardiac Risk Factors include: advanced age (>50men, >61 women);hypertension     Objective:    Today's Vitals   10/31/23 0853 11/07/23 0813  Weight:  190 lb (86.2 kg)  Height:  5\' 5"  (1.651 m)  PainSc: 0-No pain    Body mass index is 31.62 kg/m.     11/07/2023    8:25 AM 11/01/2023    9:02 AM 09/09/2023    8:47 AM 02/01/2023    9:00 AM 11/01/2022   10:22 AM 08/31/2022    9:54 AM 10/23/2021    9:38 AM  Advanced Directives  Does Patient Have a Medical Advance Directive? Yes Yes Yes Yes Yes Yes Yes  Type of Estate agent of [place];Living will Healthcare Power of [place];Living will Healthcare Power of [employer] of [place];Living will Healthcare Power of [place];Living will Living will Healthcare Power of [place];Living will;Out of facility DNR (Dickson MOST or yellow form)  Does patient want to make changes to medical advance directive?  No - Patient declined  No - Patient declined No - Patient declined No - Patient declined No - Patient declined   Copy of Healthcare Power of Attorney in Chart? No - copy requested No - copy requested  No - copy requested No - copy requested  No - copy requested    Current Medications (verified) Outpatient Encounter Medications as of 11/07/2023  Medication Sig   celecoxib (CELEBREX) 200 MG capsule One to 2 tablets by mouth daily as needed for pain.   cholecalciferol (VITAMIN D3) 25 MCG (1000 UNIT) tablet Take 1,000 Units by mouth daily.   Cyanocobalamin (VITAMIN B-12) 5000 MCG SUBL Take 5,000 mcg by mouth daily at 6 (six) AM.   Fiber Adult Gummies 2 g CHEW Chew 2 each by mouth daily.   levothyroxine (SYNTHROID) 25 MCG tablet Take in addition to tablet once per week.   levothyroxine (SYNTHROID) 50 MCG tablet Take 1 tablet (50 mcg total) by mouth daily.   Misc Natural Products (BEET ROOT PO) Take by mouth.   Multiple Vitamins-Minerals (MULTI-VITAMIN GUMMIES) CHEW Chew 2 tablets by mouth daily.   pregabalin (LYRICA) 25 MG capsule Take 1 capsule (25 mg total) by mouth 2 (two) times daily.   No facility-administered encounter medications on file as of 11/07/2023.    Allergies (verified) Meperidine hcl   History: Past Medical History:  Diagnosis Date   Allergy    Arthritis Long ago!!   Breast cancer [facility]) 2013   Right, s/p lumpectomy with brachytherapy   Colon cancer [facility]) 2021   Essential hypertension 09/18/2023   Hypothyroidism    Past Surgical History:  Procedure Laterality Date  APPENDECTOMY  2021   part of colon removal   BREAST SURGERY  2013   Rt. Lumpectomy   CHOLECYSTECTOMY     COLON SURGERY  09-12-2020   COLONOSCOPY  09/06/2020   Dr. Desmond Lope - [facility]. Moderate diverticulosis of the sigmoid colon, Mass (3cm) in the proximal ascending colon.   COLONOSCOPY  10/16/2010   Dr. Desmond Lope - [facility]. Moderate diverticulosis sigmoid colon, grade 1 internal hemorrhoids   HERNIA REPAIR     TONSILLECTOMY     TUBAL LIGATION     Family  History  Problem Relation Age of Onset   Melanoma Mother    Arthritis Mother    Cancer Mother    Vision loss Mother    Colon cancer Father    Prostate cancer Father    Liver cancer Father    Cancer Father    Colon cancer Maternal Grandfather    Cancer Maternal Grandfather    Crohn's disease Maternal Aunt        Ulcerative colitis   Cancer Brother    Rectal cancer Neg Hx    Esophageal cancer Neg Hx    Pancreatic cancer Neg Hx    Stomach cancer Neg Hx    Social History   Socioeconomic History   Marital status: Married    Spouse name: Cecille Mcclusky   Number of children: 2   Years of education: 16   Highest education level: Bachelor's degree (e.g., BA, AB, BS)  Occupational History   Occupation: Retired   Occupation: Retired  Tobacco Use   Smoking status: Never    Passive exposure: Never   Smokeless tobacco: Never  Vaping Use   Vaping status: Never Used  Substance and Sexual Activity   Alcohol use: Never   Drug use: Never   Sexual activity: Yes    Partners: Male    Birth control/protection: Post-menopausal  Other Topics Concern   Not on file  Social History Narrative   Lives with her husband. She enjoys sewing, crocheting and travelling.   Social Determinants of Health   Financial Resource Strain: Low Risk  (10/31/2023)   Overall Financial Resource Strain (CARDIA)    Difficulty of Paying Living Expenses: Not hard at all  Food Insecurity: No Food Insecurity (10/31/2023)   Hunger Vital Sign    Worried About Running Out of Food in the Last Year: Never true    Ran Out of Food in the Last Year: Never true  Transportation Needs: No Transportation Needs (10/31/2023)   PRAPARE - Administrator, Civil Service (Medical): No    Lack of Transportation (Non-Medical): No  Physical Activity: Insufficiently Active (10/31/2023)   Exercise Vital Sign    Days of Exercise per Week: 3 days    Minutes of Exercise per Session: 30 min  Stress: No Stress Concern Present  (10/31/2023)   [employer] of Occupational Health - Occupational Stress Questionnaire    Feeling of Stress : Not at all  Social Connections: Socially Integrated (10/31/2023)   Social Connection and Isolation Panel [NHANES]    Frequency of Communication with Friends and Family: More than three times a week    Frequency of Social Gatherings with Friends and Family: More than three times a week    Attends Religious Services: More than 4 times per year    Active Member of [employer] or Organizations: Yes    Attends Banker Meetings: More than 4 times per year    Marital Status:  Married    Tobacco Counseling Counseling given: Not Answered   Clinical Intake:  Pre-visit preparation completed: Yes  Pain : No/denies pain Pain Score: 0-No pain     BMI - recorded: 31.62 Nutritional Status: BMI > 30  Obese Nutritional Risks: None Diabetes: No  How often do you need to have someone help you when you read instructions, pamphlets, or other written materials from your doctor or pharmacy?: 1 - Never  Interpreter Needed?: No  Information entered by :: Theresa Mulligan LPN   Activities of Daily Living    10/31/2023    8:53 AM 09/05/2023    7:34 PM  In your present state of health, do you have any difficulty performing the following activities:  Hearing? 0 0  Vision? 0 0  Difficulty concentrating or making decisions? 1 0  Comment Easily distracted. Followed by medical attention   Walking or climbing stairs? 0 0  Dressing or bathing? 0 0  Doing errands, shopping? 0 0  Preparing Food and eating ? N N  Using the Toilet? N N  In the past six months, have you accidently leaked urine? N N  Do you have problems with loss of bowel control? N N  Managing your Medications? N N  Managing your Finances? N N  Housekeeping or managing your Housekeeping? N N    Patient Care Team: Sharlene Dory, DO as PCP - General (Family Medicine)  Indicate any recent Medical Services  you may have received from other than [facility] providers in the past year (date may be approximate).     Assessment:   This is a routine wellness examination for [facility].  Hearing/Vision screen Hearing Screening - Comments:: Denies hearing difficulties   Vision Screening - Comments:: Wears rx glasses - up to date with routine eye exams with  [facility]   Goals Addressed               This Visit's Progress     Increase physical activity (pt-stated)         Depression Screen    11/07/2023    8:31 AM 09/18/2023    9:36 AM 06/05/2023    8:57 AM 11/28/2022    8:41 AM 08/31/2022    9:57 AM 10/23/2021    9:39 AM  PHQ 2/9 Scores  PHQ - 2 Score 0 0 0 0 0 0  PHQ- 9 Score 0 0    2    Fall Risk    11/07/2023    8:24 AM 10/31/2023    8:53 AM 09/05/2023    7:34 PM 06/05/2023    8:57 AM 11/28/2022    8:40 AM  Fall Risk   Falls in the past year? 1 1 1  0 0  Number falls in past yr: 0 0 0 0 0  Injury with Fall? 0 0 0 0 0  Risk for fall due to : No Fall Risks   No Fall Risks No Fall Risks  Follow up Falls prevention discussed   Falls evaluation completed Falls evaluation completed    MEDICARE RISK AT HOME: Medicare Risk at Home Any stairs in or around the home?: Yes If so, are there any without handrails?: No Home free of loose throw rugs in walkways, pet beds, electrical cords, etc?: No Adequate lighting in your home to reduce risk of falls?: Yes Life alert?: No Use of a cane, walker or w/c?: No Grab bars in the bathroom?: No Shower chair or bench in shower?: No Elevated  toilet seat or a handicapped toilet?: No  TIMED UP AND GO:  Was the test performed?  No    Cognitive Function:        11/07/2023    8:25 AM 08/31/2022    9:58 AM  6CIT Screen  What Year? 0 points 0 points  What month? 0 points 0 points  What time? 0 points 0 points  Count back from 20 0 points 0 points  Months in reverse 0 points 0 points  Repeat phrase 2 points 2 points  Total Score 2 points 2  points    Immunizations Immunization History  Administered Date(s) Administered   Fluad Quad(high Dose 65+) 10/23/2021   Influenza-Unspecified 09/07/2022, 10/01/2023   Moderna Covid-19 Vaccine Bivalent Booster 17yrs & up 10/01/2023   Pfizer Covid-19 Vaccine Bivalent Booster 28yrs & up 11/15/2021   Pfizer(Comirnaty)Fall Seasonal Vaccine 12 years and older 11/01/2022   Pneumococcal-Unspecified 02/28/2021   Tdap 08/01/2022   Zoster Recombinant(Shingrix) 10/25/2021, 01/08/2022    TDAP status: Up to date  Flu Vaccine status: Up to date  Pneumococcal vaccine status: Due, Education has been provided regarding the importance of this vaccine. Advised may receive this vaccine at local pharmacy or Health Dept. Aware to provide a copy of the vaccination record if obtained from local pharmacy or Health Dept. Verbalized acceptance and understanding.  Covid-19 vaccine status: Declined, Education has been provided regarding the importance of this vaccine but patient still declined. Advised may receive this vaccine at local pharmacy or Health Dept.or vaccine clinic. Aware to provide a copy of the vaccination record if obtained from local pharmacy or Health Dept. Verbalized acceptance and understanding.  Qualifies for Shingles Vaccine? Yes   Zostavax completed Yes   Shingrix Completed?: Yes  Screening Tests Health Maintenance  Topic Date Due   Pneumonia Vaccine 4+ Years old (1 of 1 - PCV) 11/24/2013   Hepatitis C Screening  06/04/2024 (Originally 11/24/1968)   COVID-19 Vaccine (4 - 2023-24 season) 11/26/2023   Colonoscopy  09/22/2024   Medicare Annual Wellness (AWV)  11/06/2024   MAMMOGRAM  06/30/2025   DTaP/Tdap/Td (2 - Td or Tdap) 08/01/2032   INFLUENZA VACCINE  Completed   DEXA SCAN  Completed   Zoster Vaccines- Shingrix  Completed   HPV VACCINES  Aged Out    Health Maintenance  Health Maintenance Due  Topic Date Due   Pneumonia Vaccine 46+ Years old (1 of 1 - PCV) 11/24/2013     Colorectal cancer screening: Type of screening: Colonoscopy. Completed 09/22/21. Repeat every 3 years  Mammogram status: Completed 11/01/23. Repeat every year  Bone Density status: Completed 01/08/23. Results reflect: Bone density results: OSTEOPENIA. Repeat every   years.    Additional Screening:  Hepatitis C Screening: does qualify;  Deferred  Vision Screening: Recommended annual ophthalmology exams for early detection of glaucoma and other disorders of the eye. Is the patient up to date with their annual eye exam?  Yes  Who is the provider or what is the name of the office in which the patient attends annual eye exams? [facility] If pt is not established with a provider, would they like to be referred to a provider to establish care? No .   Dental Screening: Recommended annual dental exams for proper oral hygiene   Community Resource Referral / Chronic Care Management:  CRR required this visit?  No   CCM required this visit?  No     Plan:     I have personally reviewed and noted  the following in the patient's chart:   Medical and social history Use of alcohol, tobacco or illicit drugs  Current medications and supplements including opioid prescriptions. Patient is not currently taking opioid prescriptions. Functional ability and status Nutritional status Physical activity Advanced directives List of other physicians Hospitalizations, surgeries, and ER visits in previous 12 months Vitals Screenings to include cognitive, depression, and falls Referrals and appointments  In addition, I have reviewed and discussed with patient certain preventive protocols, quality metrics, and best practice recommendations. A written personalized care plan for preventive services as well as general preventive health recommendations were provided to patient.     Tillie Rung, LPN   16/12/958   After Visit Summary: (MyChart) Due to this being a telephonic visit, the after  visit summary with patients personalized plan was offered to patient via MyChart   Nurse Notes: None

## 2023-11-11 ENCOUNTER — Other Ambulatory Visit (INDEPENDENT_AMBULATORY_CARE_PROVIDER_SITE_OTHER): Payer: Medicare Other

## 2023-11-11 ENCOUNTER — Ambulatory Visit
Admission: RE | Admit: 2023-11-11 | Discharge: 2023-11-11 | Disposition: A | Discharge status: Home / Self Care | Payer: Medicare Other | Source: Ambulatory Visit | Attending: Gastroenterology

## 2023-11-11 ENCOUNTER — Encounter: Payer: Self-pay | Admitting: Gastroenterology

## 2023-11-11 ENCOUNTER — Ambulatory Visit (INDEPENDENT_AMBULATORY_CARE_PROVIDER_SITE_OTHER): Payer: Medicare Other | Admitting: Gastroenterology

## 2023-11-11 DIAGNOSIS — K5900 Constipation, unspecified: Secondary | ICD-10-CM | POA: Diagnosis not present

## 2023-11-11 DIAGNOSIS — Z85038 Personal history of other malignant neoplasm of large intestine: Secondary | ICD-10-CM | POA: Diagnosis not present

## 2023-11-11 DIAGNOSIS — R197 Diarrhea, unspecified: Secondary | ICD-10-CM

## 2023-11-11 DIAGNOSIS — R195 Other fecal abnormalities: Secondary | ICD-10-CM

## 2023-11-11 LAB — SEDIMENTATION RATE: Sed Rate: 16 mm/h (ref 0–30)

## 2023-11-11 LAB — C-REACTIVE PROTEIN: CRP: <1 mg/dL (ref 0.5–20.0)

## 2023-11-11 NOTE — Patient Instructions (Addendum)
_______________________________________________________  If your blood pressure at your visit was 140/90 or greater, please contact your primary care physician to follow up on this. _______________________________________________________  If you are age 75 or older, your body mass index should be between 23-30. Your Body mass index is 31.75 kg/m. If this is out of the aforementioned range listed, please consider follow up with your Primary Care Provider. ________________________________________________________  Your provider has requested that you go to the basement level for lab work before leaving today. Press "B" on the elevator. The lab is located at the first door on the left as you exit the elevator.  Your provider has requested that you have an abdominal x ray before leaving today. Please go to the basement floor to our Radiology department for the test.  Please purchase the following medications over the counter and take as directed:  START: Benefiber daily.  The [facility] providers would like to encourage you to use [facility] to communicate with providers for non-urgent requests or questions.  Due to long hold times on the telephone, sending your provider a message by [facility] may be a faster and more efficient way to get a response.  Please allow 48 business hours for a response.  Please remember that this is for non-urgent requests.  _______________________________________________________  It was a pleasure to see you today!  Vito Cirigliano, D.O.

## 2023-11-11 NOTE — Progress Notes (Signed)
Chief Complaint:    Change in bowel habits  GI History: 73 year old female with a history of hypothyroidism, breast cancer s/p lumpectomy, colon cancer  s/p robotic right hemicolectomy in 08/2020 as outlined below, prior hemorrhoidectomy, cholecystectomy, tubal ligation, hernia surgery.  History of chronic constipation since right hemicolectomy.  Prior pertinent GI history: - Colonoscopy (10/16/2010): Moderate diverticulosis sigmoid colon, grade 1 internal hemorrhoids - Colonoscopy (09/06/2020): 3 cm ulcerating mass in the proximal ascending colon.  Pathology: Invasive adenocarcinoma.  15 mm polyp adjacent to the mass, not resected endoscopically as this would be resected surgically with the mass.  Moderate diverticulosis of the sigmoid, grade 1 internal hemorrhoids - Labs from 09/07/2020: Normal CBC.  CEA 1.7.  Normal CMP (BG 116) - CT abdomen/pelvis (09/08/2020): Liver unremarkable, normal spleen, pancreas.  No evidence of metastasis.  Nodular thickening of ascending colon consistent with history of colon carcinoma. - Robotic Right hemicolectomy (09/12/2020): Path with invasive, moderately differentiated adenocarcinoma measuring 2.6 cm.  Tumor invades submucosa.  12 lymph nodes negative for metastatic carcinoma.  Margins negative for adenoma and malignancy. - 11/11/2020: GI follow-up.  No GI complaints at that time.  Recommended repeat colonoscopy in 08/2021 - 09/14/2021: Initial appointment in this GI clinic and scheduled for repeat colonoscopy for surveillance. - 09/22/2021: Colonoscopy: Healthy appearing ileocolonic anastomosis, left-sided diverticulosis, internal hemorrhoids.  Normal neo-TI.  Repeat in 3 years.    Family history notable for the following: - Maternal aunt with Crohn's disease/ulcerative colitis - Grandfather with colon cancer  HPI:     Patient is a 73 y.o. female presenting to the Gastroenterology Clinic for evaluation of change in bowel habits.  Was initially seen by me on  09/14/2021 to establish care and scheduled for repeat colonoscopy for CRC surveillance.  Colonoscopy in 08/2021 with normal-appearing anastomosis and otherwise healthy appearing colonic mucosa.  Has a history of constipation since her right hemicolectomy, previously treated with MiraLAX.  However, started developing diarrhea over the last 6-9 months.  Started weaning then eventually discontinue to the MiraLAX, but ongoing episodic diarrhea.  Can have up to 2-4 loose to watery, nonbloody stools daily, occurring 2-5 days out of the week.  Can then also have constipation an formed stool.  Did try flax seed with subsequent constipation.  No abdominal pain.  No upper GI symptoms.  Was previously prescribed nortriptyline about 2 months ago for hot flashes, which she discontinued due to dizziness.  Now on Lyrica but having similar ADRs.  Change in bowel habits predates each of these medications.  Otherwise, no new medications, supplements, etc.  Was recently seen by Dr. Truett Perna in the Oncology Clinic on 11/01/2023.  Doing well from an oncologic standpoint with plan for RTC in 1 year.  Reviewed labs from 09/2023: Normal CBC, CMP, lipase.  Also with normal TSH in 06/2023.  -09/09/2023: CT A/P: Postoperative changes from prior right hemicolectomy, but otherwise normal-appearing GI tract.  Review of systems:     No chest pain, no SOB, no fevers, no urinary sx   Past Medical History:  Diagnosis Date   Allergy    Arthritis Long ago!!   Breast cancer (HCC) 2013   Right, s/p lumpectomy with brachytherapy   Colon cancer (HCC) 2021   Essential hypertension 09/18/2023   Hypothyroidism     Patient's surgical history, family medical history, social history, medications and allergies were all reviewed in Epic    Current Outpatient Medications  Medication Sig Dispense Refill   celecoxib (CELEBREX) 200 MG capsule One to  2 tablets by mouth daily as needed for pain. 180 capsule 3   cholecalciferol (VITAMIN D3) 25 MCG  (1000 UNIT) tablet Take 1,000 Units by mouth daily.     Cyanocobalamin (VITAMIN B-12) 5000 MCG SUBL Take 5,000 mcg by mouth daily at 6 (six) AM.     Fiber Adult Gummies 2 g CHEW Chew 2 each by mouth daily.     levothyroxine (SYNTHROID) 25 MCG tablet Take in addition to tablet once per week. 30 tablet 1   levothyroxine (SYNTHROID) 50 MCG tablet Take 1 tablet (50 mcg total) by mouth daily. 90 tablet 3   Misc Natural Products (BEET ROOT PO) Take by mouth.     Multiple Vitamins-Minerals (MULTI-VITAMIN GUMMIES) CHEW Chew 2 tablets by mouth daily.     pregabalin (LYRICA) 25 MG capsule Take 1 capsule (25 mg total) by mouth 2 (two) times daily. 60 capsule 0   No current facility-administered medications for this visit.    Physical Exam:     There were no vitals taken for this visit.  GENERAL:  Pleasant female in NAD PSYCH: : Cooperative, normal affect EENT:  conjunctiva pink, mucous membranes moist, neck supple without masses CARDIAC:  RRR, no murmur heard, no peripheral edema PULM: Normal respiratory effort, lungs CTA bilaterally, no wheezing ABDOMEN:  Nondistended, soft, nontender. No obvious masses, no hepatomegaly,  normal bowel sounds SKIN:  turgor, no lesions seen Musculoskeletal:  Normal muscle tone, normal strength NEURO: Alert and oriented x 3, no focal neurologic deficits   IMPRESSION and PLAN:    1) Change in bowel habits 2) Constipation 3) Diarrhea 73 year old female with history of colon cancer s/p right hemicolectomy 2021, with subsequent constipation.  Over the last 6-9 months has developed diarrhea.  Discussed DDx with her today to include constipation with overflow diarrhea, medication ADR, IBS, microscopic colitis, postoperative diarrhea, etc. with plan as follows:  - Trial course of Benefiber - Check ESR, CRP, fecal calprotectin - Check pancreatic elastase - Increase water consumption with a goal of 64 ounces daily - Check two-view abdominal x-ray to evaluate  for degree of retained stool - If evaluation unrevealing and no change in symptoms, plan for colonoscopy with random and directed biopsies  4) History of colon cancer Due for repeat colonoscopy in 08/2024 for ongoing surveillance.  However, may plan to be done earlier from a diagnostic standpoint as outlined above.          Verlin Dike Aanshi Batchelder ,DO, FACG 11/11/2023, 1:12 PM

## 2023-11-12 ENCOUNTER — Other Ambulatory Visit: Payer: Medicare Other

## 2023-11-12 DIAGNOSIS — K5900 Constipation, unspecified: Secondary | ICD-10-CM

## 2023-11-12 DIAGNOSIS — Z85038 Personal history of other malignant neoplasm of large intestine: Secondary | ICD-10-CM

## 2023-11-12 DIAGNOSIS — R195 Other fecal abnormalities: Secondary | ICD-10-CM

## 2023-11-12 DIAGNOSIS — R197 Diarrhea, unspecified: Secondary | ICD-10-CM

## 2023-11-12 NOTE — Progress Notes (Signed)
Specimen drop off

## 2023-11-14 LAB — CALPROTECTIN, FECAL: Calprotectin, Fecal: 34 ug/g (ref 0–120)

## 2023-11-18 ENCOUNTER — Encounter: Payer: Self-pay | Admitting: Family Medicine

## 2023-11-18 ENCOUNTER — Ambulatory Visit (INDEPENDENT_AMBULATORY_CARE_PROVIDER_SITE_OTHER): Payer: Medicare Other | Admitting: Family Medicine

## 2023-11-18 VITALS — BP 128/76 | HR 88 | Temp 98.0°F | Resp 16 | Ht 65.0 in | Wt 188.8 lb

## 2023-11-18 DIAGNOSIS — G4700 Insomnia, unspecified: Secondary | ICD-10-CM

## 2023-11-18 NOTE — Patient Instructions (Signed)
Send me a message if you change your mind about trying Paxil/paroxetine.   Let us know if you need anything.

## 2023-11-18 NOTE — Progress Notes (Signed)
Chief Complaint  Patient presents with   Follow-up    Follow up    Subjective: Patient is a 73 y.o. female here for f/u.  Patient was started on Lyrica 25 mg twice daily for combination of hot flashes and insomnia.  It did not help for insomnia but it did help reduce severity of her hot flashes.  They have not fully returned.  She did have some lightheadedness with the Lyrica and has since stopped around 5 days ago.  She is returned to antihistamines to help her sleep and is doing well with that.  Past Medical History:  Diagnosis Date   Allergy    Arthritis Long ago!!   Breast cancer [facility]) 2013   Right, s/p lumpectomy with brachytherapy   Colon cancer (HCC) 2021   Essential hypertension 09/18/2023   Hypothyroidism     Objective: BP 128/76 (BP Location: Left Arm, Patient Position: Sitting, Cuff Size: Normal)   Pulse 88   Temp 98 F (36.7 C) (Oral)   Resp 16   Ht 5\' 5"  (1.651 m)   Wt 188 lb 12.8 oz (85.6 kg)   SpO2 96%   BMI 31.42 kg/m  General: Awake, appears stated age Heart: RRR, no LE edema Lungs: CTAB, no rales, wheezes or rhonchi. No accessory muscle use Psych: Age appropriate judgment and insight, normal affect and mood  Assessment and Plan: Insomnia, unspecified type  Okay to continue over-the-counter medication to help her sleep.  She is not [facility] she is having a side effects.  She does not wish to try anything else at this time.  Consider Paxil 10 mg nightly if no improvement or return of hot flashes.  Follow-up in 6 months otherwise. The patient voiced understanding and agreement to the plan.  Jilda Roche [place], DO 11/18/23  5:06 PM

## 2023-11-24 LAB — PANCREATIC ELASTASE, FECAL: Pancreatic Elastase-1, Stool: >800 ug/g (ref >200)

## 2023-12-05 ENCOUNTER — Ambulatory Visit: Payer: Medicare Other | Admitting: Family Medicine

## 2023-12-17 ENCOUNTER — Other Ambulatory Visit: Payer: Self-pay | Admitting: Medical Genetics

## 2023-12-26 ENCOUNTER — Encounter: Payer: Self-pay | Admitting: Sports Medicine

## 2023-12-26 DIAGNOSIS — M47816 Spondylosis without myelopathy or radiculopathy, lumbar region: Secondary | ICD-10-CM

## 2024-01-14 NOTE — Discharge Instructions (Signed)

## 2024-01-15 ENCOUNTER — Ambulatory Visit
Admission: RE | Admit: 2024-01-15 | Discharge: 2024-01-15 | Disposition: A | Discharge status: Home / Self Care | Payer: Medicare Other | Source: Ambulatory Visit | Attending: Sports Medicine | Admitting: Sports Medicine

## 2024-01-15 DIAGNOSIS — M47816 Spondylosis without myelopathy or radiculopathy, lumbar region: Secondary | ICD-10-CM

## 2024-01-15 MED ORDER — IOPAMIDOL (ISOVUE-M 200) INJECTION 41%
1.0000 mL | Freq: Once | INTRAMUSCULAR | Status: AC
  Administered 2024-01-15: 1 mL via EPIDURAL

## 2024-01-15 MED ORDER — METHYLPREDNISOLONE ACETATE 40 MG/ML INJ SUSP (RADIOLOG
80.0000 mg | Freq: Once | INTRAMUSCULAR | Status: AC
  Administered 2024-01-15: 80 mg via EPIDURAL

## 2024-01-23 ENCOUNTER — Other Ambulatory Visit (HOSPITAL_COMMUNITY)
Admission: RE | Admit: 2024-01-23 | Discharge: 2024-01-23 | Disposition: A | Discharge status: Home / Self Care | Payer: Self-pay | Source: Ambulatory Visit | Attending: Medical Genetics | Admitting: Medical Genetics

## 2024-02-04 LAB — GENECONNECT MOLECULAR SCREEN: Genetic Analysis Overall Interpretation: NEGATIVE

## 2024-02-14 ENCOUNTER — Encounter: Payer: Self-pay | Admitting: Family Medicine

## 2024-02-14 ENCOUNTER — Ambulatory Visit: Payer: Medicare Other | Admitting: Family Medicine

## 2024-02-14 VITALS — BP 130/76 | HR 83 | Temp 98.0°F | Resp 16 | Ht 65.0 in | Wt 188.2 lb

## 2024-02-14 DIAGNOSIS — Z7184 Encounter for health counseling related to travel: Secondary | ICD-10-CM | POA: Diagnosis not present

## 2024-02-14 DIAGNOSIS — R519 Headache, unspecified: Secondary | ICD-10-CM | POA: Diagnosis not present

## 2024-02-14 MED ORDER — AZITHROMYCIN 500 MG PO TABS: ORAL_TABLET | ORAL | 0 refills | Status: DC

## 2024-02-14 MED ORDER — MEFLOQUINE HCL 250 MG PO TABS
ORAL_TABLET | ORAL | 0 refills | Status: DC
Start: 2024-02-14 — End: 2024-02-24

## 2024-02-14 NOTE — Patient Instructions (Addendum)
 Keep an eye on your headaches. This could be related to your muscles vs grinding your teeth. Let me know if anything changes.   Stay hydrated.   Let us know if you need anything.  Trapezius stretches/exercises Do exercises exactly as told by your health care provider and adjust them as directed. It is normal to feel mild stretching, pulling, tightness, or discomfort as you do these exercises, but you should stop right away if you feel sudden pain or your pain gets worse.   Stretching and range of motion exercises These exercises warm up your muscles and joints and improve the movement and flexibility of your shoulder. These exercises can also help to relieve pain, numbness, and tingling. If you are unable to do any of the following for any reason, do not further attempt to do it.   Exercise A: Flexion, standing     Stand and hold a broomstick, a cane, or a similar object. Place your hands a little more than shoulder-width apart on the object. Your left / right hand should be palm-up, and your other hand should be palm-down. Push the stick to raise your left / right arm out to your side and then over your head. Use your other hand to help move the stick. Stop when you feel a stretch in your shoulder, or when you reach the angle that is recommended by your health care provider. Avoid shrugging your shoulder while you raise your arm. Keep your shoulder blade tucked down toward your spine. Hold for 30 seconds. Slowly return to the starting position. Repeat 2 times. Complete this exercise 3 times per week.  Exercise B: Abduction, supine     Lie on your back and hold a broomstick, a cane, or a similar object. Place your hands a little more than shoulder-width apart on the object. Your left / right hand should be palm-up, and your other hand should be palm-down. Push the stick to raise your left / right arm out to your side and then over your head. Use your other hand to help move the stick. Stop  when you feel a stretch in your shoulder, or when you reach the angle that is recommended by your health care provider. Avoid shrugging your shoulder while you raise your arm. Keep your shoulder blade tucked down toward your spine. Hold for 30 seconds. Slowly return to the starting position. Repeat 2 times. Complete this exercise 3 times per week.  Exercise C: Flexion, active-assisted     Lie on your back. You may bend your knees for comfort. Hold a broomstick, a cane, or a similar object. Place your hands about shoulder-width apart on the object. Your palms should face toward your feet. Raise the stick and move your arms over your head and behind your head, toward the floor. Use your healthy arm to help your left / right arm move farther. Stop when you feel a gentle stretch in your shoulder, or when you reach the angle where your health care provider tells you to stop. Hold for 30 seconds. Slowly return to the starting position. Repeat 2 times. Complete this exercise 3 times per week.  Exercise D: External rotation and abduction     Stand in a door frame with one of your feet slightly in front of the other. This is called a staggered stance. Choose one of the following positions as told by your health care provider: Place your hands and forearms on the door frame above your head. Place your hands  and forearms on the door frame at the height of your head. Place your hands on the door frame at the height of your elbows. Slowly move your weight onto your front foot until you feel a stretch across your chest and in the front of your shoulders. Keep your head and chest upright and keep your abdominal muscles tight. Hold for 30 seconds. To release the stretch, shift your weight to your back foot. Repeat 2 times. Complete this stretch 3 times per week.  Strengthening exercises These exercises build strength and endurance in your shoulder. Endurance is the ability to use your muscles for a  long time, even after your muscles get tired. Exercise E: Scapular depression and adduction  Sit on a stable chair. Support your arms in front of you with pillows, armrests, or a tabletop. Keep your elbows in line with the sides of your body. Gently move your shoulder blades down toward your middle back. Relax the muscles on the tops of your shoulders and in the back of your neck. Hold for 3 seconds. Slowly release the tension and relax your muscles completely before doing this exercise again. Repeat for a total of 10 repetitions. After you have practiced this exercise, try doing the exercise without the arm support. Then, try the exercise while standing instead of sitting. Repeat 2 times. Complete this exercise 3 times per week.  Exercise F: Shoulder abduction, isometric     Stand or sit about 4-6 inches (10-15 cm) from a wall with your left / right side facing the wall. Bend your left / right elbow and gently press your elbow against the wall. Increase the pressure slowly until you are pressing as hard as you can without shrugging your shoulder. Hold for 3 seconds. Slowly release the tension and relax your muscles completely. Repeat for a total of 10 repetitions. Repeat 2 times. Complete this exercise 3 times per week.  Exercise G: Shoulder flexion, isometric     Stand or sit about 4-6 inches (10-15 cm) away from a wall with your left / right side facing the wall. Keep your left / right elbow straight and gently press the top of your fist against the wall. Increase the pressure slowly until you are pressing as hard as you can without shrugging your shoulder. Hold for 10-15 seconds. Slowly release the tension and relax your muscles completely. Repeat for a total of 10 repetitions. Repeat 2 times. Complete this exercise 3 times per week.  Exercise H: Internal rotation     Sit in a stable chair without armrests, or stand. Secure an exercise band at your left / right side, at elbow  height. Place a soft object, such as a folded towel or a small pillow, under your left / right upper arm so your elbow is a few inches (about 8 cm) away from your side. Hold the end of the exercise band so the band stretches. Keeping your elbow pressed against the soft object under your arm, move your forearm across your body toward your abdomen. Keep your body steady so the movement is only coming from your shoulder. Hold for 3 seconds. Slowly return to the starting position. Repeat for a total of 10 repetitions. Repeat 2 times. Complete this exercise 3 times per week.  Exercise I: External rotation     Sit in a stable chair without armrests, or stand. Secure an exercise band at your left / right side, at elbow height. Place a soft object, such as a folded towel  or a small pillow, under your left / right upper arm so your elbow is a few inches (about 8 cm) away from your side. Hold the end of the exercise band so the band stretches. Keeping your elbow pressed against the soft object under your arm, move your forearm out, away from your abdomen. Keep your body steady so the movement is only coming from your shoulder. Hold for 3 seconds. Slowly return to the starting position. Repeat for a total of 10 repetitions. Repeat 2 times. Complete this exercise 3 times per week. Exercise J: Shoulder extension  Sit in a stable chair without armrests, or stand. Secure an exercise band to a stable object in front of you so the band is at shoulder height. Hold one end of the exercise band in each hand. Your palms should face each other. Straighten your elbows and lift your hands up to shoulder height. Step back, away from the secured end of the exercise band, until the band stretches. Squeeze your shoulder blades together and pull your hands down to the sides of your thighs. Stop when your hands are straight down by your sides. Do not let your hands go behind your body. Hold for 3 seconds. Slowly return  to the starting position. Repeat for a total of 10 repetitions. Repeat 2 times. Complete this exercise 3 times per week.  Exercise K: Shoulder extension, prone     Lie on your abdomen on a firm surface so your left / right arm hangs over the edge. Hold a 5 lb weight in your hand so your palm faces in toward your body. Your arm should be straight. Squeeze your shoulder blade down toward the middle of your back. Slowly raise your arm behind you, up to the height of the surface that you are lying on. Keep your arm straight. Hold for 3 seconds. Slowly return to the starting position and relax your muscles. Repeat for a total of 10 repetitions. Repeat 2 times. Complete this exercise 3 times per week.   Exercise L: Horizontal abduction, prone  Lie on your abdomen on a firm surface so your left / right arm hangs over the edge. Hold a 5 lb weight in your hand so your palm faces toward your feet. Your arm should be straight. Squeeze your shoulder blade down toward the middle of your back. Bend your elbow so your hand moves up, until your elbow is bent to an "L" shape (90 degrees). With your elbow bent, slowly move your forearm forward and up. Raise your hand up to the height of the surface that you are lying on. Your upper arm should not move, and your elbow should stay bent. At the top of the movement, your palm should face the floor. Hold for 3 seconds. Slowly return to the starting position and relax your muscles. Repeat for a total of 10 repetitions. Repeat 2 times. Complete this exercise 3 times per week.  Exercise M: Horizontal abduction, standing  Sit on a stable chair, or stand. Secure an exercise band to a stable object in front of you so the band is at shoulder height. Hold one end of the exercise band in each hand. Straighten your elbows and lift your hands straight in front of you, up to shoulder height. Your palms should face down, toward the floor. Step back, away from the secured  end of the exercise band, until the band stretches. Move your arms out to your sides, and keep your arms straight. Hold for  3 seconds. Slowly return to the starting position. Repeat for a total of 10 repetitions. Repeat 2 times. Complete this exercise 3 times per week.  Exercise N: Scapular retraction and elevation  Sit on a stable chair, or stand. Secure an exercise band to a stable object in front of you so the band is at shoulder height. Hold one end of the exercise band in each hand. Your palms should face each other. Sit in a stable chair without armrests, or stand. Step back, away from the secured end of the exercise band, until the band stretches. Squeeze your shoulder blades together and lift your hands over your head. Keep your elbows straight. Hold for 3 seconds. Slowly return to the starting position. Repeat for a total of 10 repetitions. Repeat 2 times. Complete this exercise 3 times per week.  This information is not intended to replace advice given to you by your health care provider. Make sure you discuss any questions you have with your health care provider. Document Released: 12/17/2005 Document Revised: 08/23/2016 Document Reviewed: 11/03/2015 Elsevier Interactive Patient Education  2017 [employer].

## 2024-02-14 NOTE — Progress Notes (Signed)
Chief Complaint  Patient presents with   Follow-up    Follow up    Subjective: Patient is a 74 y.o. female here for follow-up.  Patient is going to [place] and [place] in a couple months.  She is here for malaria prophylaxis and maybe vaccinations.  Over the past 9 days, the patient has had intermittent headaches lasting for several minutes.  Sometimes it happens in the morning.  She does have a history of grinding her teeth.  No neck pain, vision changes, difficulty swallowing that is new, new balance issues, or weakness.  Past Medical History:  Diagnosis Date   Allergy    Arthritis Long ago!!   Breast cancer [facility]) 2013   Right, s/p lumpectomy with brachytherapy   Colon cancer (HCC) 2021   Essential hypertension 09/18/2023   Hypothyroidism     Objective: BP 130/76 (BP Location: Left Arm, Patient Position: Sitting)   Pulse 83   Temp 98 F (36.7 C) (Oral)   Resp 16   Ht 5\' 5"  (1.651 m)   Wt 188 lb 3.2 oz (85.4 kg)   SpO2 98%   BMI 31.32 kg/m  General: Awake, appears stated age Heart: RRR, no LE edema Lungs: CTAB, no rales, wheezes or rhonchi. No accessory muscle use Neuro: DTRs equal and symmetric throughout, no clonus, no cerebellar signs, gait is normal, 5/5 strength throughout MSK: Hypertonicity noted over the bilateral trapezius musculature.  No TTP over the suboccipital triangle, cervical midline, or cervical paraspinal musculature.  There is no tenderness over the temporalis or TMJ bilaterally.  There is no jaw deviation or clicking of the TMJ with motion. Psych: Age appropriate judgment and insight, normal affect and mood  Assessment and Plan: Travel advice encounter - Plan: mefloquine (LARIAM) 250 MG tablet, azithromycin (ZITHROMAX) 500 MG tablet  Acute nonintractable headache, unspecified headache type  Azithromycin 500 mg daily for 3 days in case of traveler's diarrhea or pneumonia.  Lariam 250 mg weekly for 2 weeks prior to departure and continuing for 4  weeks after return. Stretches and exercises for the trapezius provided.  Heat, ice, Tylenol.  Does not sound like anything sinister.  Underlying TMJ for bruxism also a consideration.  She will let me know if anything changes. The patient voiced understanding and agreement to the plan.  Jilda Roche [place], DO 02/14/24  3:19 PM

## 2024-02-21 ENCOUNTER — Encounter: Payer: Self-pay | Admitting: Family Medicine

## 2024-02-24 ENCOUNTER — Other Ambulatory Visit: Payer: Self-pay | Admitting: Family Medicine

## 2024-02-24 MED ORDER — ATOVAQUONE-PROGUANIL HCL 250-100 MG PO TABS: ORAL_TABLET | ORAL | 0 refills | Status: DC

## 2024-03-17 ENCOUNTER — Other Ambulatory Visit: Payer: Self-pay | Admitting: Family Medicine

## 2024-03-17 MED ORDER — TYPHOID VACCINE PO CPDR: 1.0000 | DELAYED_RELEASE_CAPSULE | ORAL | 0 refills | Status: DC

## 2024-03-23 ENCOUNTER — Encounter: Payer: Self-pay | Admitting: Sports Medicine

## 2024-03-23 ENCOUNTER — Ambulatory Visit (INDEPENDENT_AMBULATORY_CARE_PROVIDER_SITE_OTHER): Admitting: Sports Medicine

## 2024-03-23 ENCOUNTER — Other Ambulatory Visit (INDEPENDENT_AMBULATORY_CARE_PROVIDER_SITE_OTHER): Payer: Self-pay

## 2024-03-23 DIAGNOSIS — M25552 Pain in left hip: Secondary | ICD-10-CM

## 2024-03-23 MED ORDER — TRIAMCINOLONE ACETONIDE 40 MG/ML IJ SUSP
40.0000 mg | Freq: Once | INTRAMUSCULAR | Status: AC
  Administered 2024-03-23: 40 mg via INTRAMUSCULAR

## 2024-03-23 NOTE — Progress Notes (Signed)
    Procedures performed today:    Procedure: Real-time Ultrasound Guided injection of the left trochanteric bursa/hip abductor sheath Device: Samsung HS60  Verbal informed consent obtained.  Time-out conducted.  Noted no overlying erythema, induration, or other signs of local infection.  Skin prepped in a sterile fashion.  Local anesthesia: Topical Ethyl chloride.  With sterile technique and under real time ultrasound guidance: Noted diminutive hip abductor's, 1 cc Kenalog 40, 2 cc lidocaine, 2 cc bupivacaine injected easily Completed without difficulty  Advised to call if fevers/chills, erythema, induration, drainage, or persistent bleeding.  Images permanently stored and available for review in PACS.  Impression: Technically successful ultrasound guided injection.  Independent interpretation of notes and tests performed by another provider:   None.  Brief History, Exam, Impression, and Recommendations:    Left hip pain This is a very pleasant 74 year old female, she has multifactorial left hip pain, refractory greater trochanteric bursitis, she does have some severe atrophy on imaging. She has failed multiple modalities in the past including physical therapy, steroid injections, Tenex, she had marginal benefit with Celebrex per We did a hip bursa/hip abductor tendon sheath injection back in 2023 and added aggressive hip abductor conditioning. She did well with conditioning and she had excellent relief until now. She is now having recurrence of pain left lateral hip, we did an injection again, and I have advised her to focus aggressively on hip abductor strengthening. She understands that if her hip abductors are weak this will continue to recur. She can return to see me as needed.    ____________________________________________ Ihor Austin. Benjamin Stain, M.D., ABFM., CAQSM., AME. Primary Care and Sports Medicine [facility]  Adjunct Professor of Family  Medicine  [employer]  Restaurant manager, fast food

## 2024-03-23 NOTE — Assessment & Plan Note (Signed)
 This is a very pleasant 74 year old female, she has multifactorial left hip pain, refractory greater trochanteric bursitis, she does have some severe atrophy on imaging. She has failed multiple modalities in the past including physical therapy, steroid injections, Tenex, she had marginal benefit with Celebrex per We did a hip bursa/hip abductor tendon sheath injection back in 2023 and added aggressive hip abductor conditioning. She did well with conditioning and she had excellent relief until now. She is now having recurrence of pain left lateral hip, we did an injection again, and I have advised her to focus aggressively on hip abductor strengthening. She understands that if her hip abductors are weak this will continue to recur. She can return to see me as needed.

## 2024-03-26 ENCOUNTER — Encounter: Payer: Self-pay | Admitting: Sports Medicine

## 2024-03-30 ENCOUNTER — Other Ambulatory Visit: Payer: Self-pay | Admitting: Sports Medicine

## 2024-03-30 DIAGNOSIS — M25552 Pain in left hip: Secondary | ICD-10-CM

## 2024-03-31 ENCOUNTER — Ambulatory Visit: Admitting: Sports Medicine

## 2024-04-01 ENCOUNTER — Other Ambulatory Visit: Payer: Self-pay | Admitting: Family Medicine

## 2024-04-01 DIAGNOSIS — M25552 Pain in left hip: Secondary | ICD-10-CM

## 2024-04-01 NOTE — Telephone Encounter (Signed)
 Copied from CRM [id number]. Topic: Clinical - Medication Refill >> Apr 01, 2024 10:24 AM Claudine Mouton wrote: Most Recent Primary Care Visit:  Provider: Monica Becton  Department: [facility]  Visit Type: OFFICE VISIT  Date: 03/23/2024  Medication: celecoxib (CELEBREX) 200 MG capsule   Has the patient contacted their pharmacy? Yes (Agent: If no, request that the patient contact the pharmacy for the refill. If patient does not wish to contact the pharmacy document the reason why and proceed with request.) (Agent: If yes, when and what did the pharmacy advise?)  Is this the correct pharmacy for this prescription? Yes If no, delete pharmacy and type the correct one.  This is the patient's preferred pharmacy:   [facility] [address] [place] - [address] [place] [address] Phone: [phone] Fax: [phone]   Has the prescription been filled recently? No  Is the patient out of the medication? Yes  Has the patient been seen for an appointment in the last year OR does the patient have an upcoming appointment? Yes  Can we respond through MyChart? No  Agent: Please be advised that Rx refills may take up to 3 business days. We ask that you follow-up with your pharmacy.

## 2024-05-18 ENCOUNTER — Ambulatory Visit: Payer: Medicare Other | Admitting: Family Medicine

## 2024-06-15 ENCOUNTER — Encounter: Payer: Self-pay | Admitting: Family Medicine

## 2024-06-15 ENCOUNTER — Ambulatory Visit (INDEPENDENT_AMBULATORY_CARE_PROVIDER_SITE_OTHER): Payer: Medicare Other | Admitting: Family Medicine

## 2024-06-15 VITALS — BP 128/74 | HR 81 | Temp 98.0°F | Resp 16 | Ht 65.0 in | Wt 196.4 lb

## 2024-06-15 DIAGNOSIS — R7303 Prediabetes: Secondary | ICD-10-CM

## 2024-06-15 DIAGNOSIS — Z1159 Encounter for screening for other viral diseases: Secondary | ICD-10-CM | POA: Diagnosis not present

## 2024-06-15 DIAGNOSIS — I10 Essential (primary) hypertension: Secondary | ICD-10-CM | POA: Diagnosis not present

## 2024-06-15 DIAGNOSIS — E039 Hypothyroidism, unspecified: Secondary | ICD-10-CM

## 2024-06-15 NOTE — Progress Notes (Signed)
 Chief Complaint  Patient presents with   Follow-up    Follow Up    Subjective: Patient is a 74 y.o. female here for f/u.  Hypothyroidism Patient presents for follow-up of hypothyroidism.  Reports compliance with medication- levothyroxine  50 mcg/d and an additional 50 mcg weekly dosage. Current symptoms are well controlled.  Denies: denies fatigue, weight loss, heat/cold intolerance, bowel/skin changes or CVS symptoms She believes her dose should be not significantly changed  Past Medical History:  Diagnosis Date   Allergy    Arthritis Long ago!!   Breast cancer (HCC) 2013   Right, s/p lumpectomy with brachytherapy   Colon cancer (HCC) 2021   Essential hypertension 09/18/2023   Hypothyroidism     Objective: BP 128/74 (BP Location: Left Arm, Patient Position: Sitting)   Pulse 81   Temp 98 F (36.7 C) (Oral)   Resp 16   Ht 5' 5 (1.651 m)   Wt 196 lb 6.4 oz (89.1 kg)   SpO2 96%   BMI 32.68 kg/m  General: Awake, appears stated age Neck: No masses or obvious thyromegaly Heart: RRR, trace pitting b/l LE edema Lungs: CTAB, no rales, wheezes or rhonchi. No accessory muscle use Psych: Age appropriate judgment and insight, normal affect and mood  Assessment and Plan: Acquired hypothyroidism - Plan: T4, free, TSH, TSH  Prediabetes - Plan: Hemoglobin A1c, Hemoglobin A1c Essential hypertension - Plan: CBC, Comprehensive metabolic panel with GFR, Lipid panel, Lipid panel, Comprehensive metabolic panel with GFR  Encounter for hepatitis C screening test for low risk patient - Plan: Hepatitis C antibody  Chronic, stable. Cont levothyroxine  50 mcg/d and an additional 50 mcg weekly dosage. Ck labs. F/u in 6 mo for a med ck or prn.  The patient voiced understanding and agreement to the plan.  Shellie Dials [place], DO 06/15/24  7:27 AM

## 2024-06-15 NOTE — Patient Instructions (Signed)
 Give [place] 2-3 business days to get the results of your labs back.   Keep the diet clean and stay active.  Let us know if you need anything.

## 2024-06-16 LAB — TSH: TSH: 1.73 m[IU]/L (ref 0.40–4.50)

## 2024-06-16 LAB — LIPID PANEL
Cholesterol: 202 mg/dL — ABNORMAL HIGH (ref <200)
HDL: 66 mg/dL (ref >=50)
LDL Cholesterol (Calc): 110 mg/dL — ABNORMAL HIGH
Non-HDL Cholesterol (Calc): 136 mg/dL — ABNORMAL HIGH (ref <130)
Total CHOL/HDL Ratio: 3.1 (calc) (ref <5.0)
Triglycerides: 151 mg/dL — ABNORMAL HIGH (ref <150)

## 2024-06-16 LAB — COMPREHENSIVE METABOLIC PANEL WITH GFR
AG Ratio: 1.7 (calc) (ref 1.0–2.5)
ALT: 16 U/L (ref 6–29)
AST: 22 U/L (ref 10–35)
Albumin: 4 g/dL (ref 3.6–5.1)
Alkaline phosphatase (APISO): 81 U/L (ref 37–153)
BUN: 14 mg/dL (ref 7–25)
CO2: 28 mmol/L (ref 20–32)
Calcium: 9.3 mg/dL (ref 8.6–10.4)
Chloride: 105 mmol/L (ref 98–110)
Creat: 0.91 mg/dL (ref 0.60–1.00)
Globulin: 2.4 g/dL (ref 1.9–3.7)
Glucose, Bld: 117 mg/dL — ABNORMAL HIGH (ref 65–99)
Potassium: 4.2 mmol/L (ref 3.5–5.3)
Sodium: 142 mmol/L (ref 135–146)
Total Bilirubin: 0.5 mg/dL (ref 0.2–1.2)
Total Protein: 6.4 g/dL (ref 6.1–8.1)
eGFR: 66 mL/min/{1.73_m2} (ref >=60)

## 2024-06-16 LAB — HEMOGLOBIN A1C
Hgb A1c MFr Bld: 6.4 % — ABNORMAL HIGH (ref <5.7)
Mean Plasma Glucose: 137 mg/dL
eAG (mmol/L): 7.6 mmol/L

## 2024-06-16 LAB — CBC
HCT: 45.1 % — ABNORMAL HIGH (ref 35.0–45.0)
Hemoglobin: 14.4 g/dL (ref 11.7–15.5)
MCH: 28.5 pg (ref 27.0–33.0)
MCHC: 31.9 g/dL — ABNORMAL LOW (ref 32.0–36.0)
MCV: 89.1 fL (ref 80.0–100.0)
MPV: 10.2 fL (ref 7.5–12.5)
Platelets: 223 10*3/uL (ref 140–400)
RBC: 5.06 10*6/uL (ref 3.80–5.10)
RDW: 12.5 % (ref 11.0–15.0)
WBC: 5.1 10*3/uL (ref 3.8–10.8)

## 2024-06-16 LAB — HEPATITIS C ANTIBODY: Hepatitis C Ab: NONREACTIVE

## 2024-06-17 ENCOUNTER — Ambulatory Visit: Payer: Self-pay | Admitting: Family Medicine

## 2024-06-27 ENCOUNTER — Other Ambulatory Visit: Payer: Self-pay | Admitting: Sports Medicine

## 2024-06-27 DIAGNOSIS — M25552 Pain in left hip: Secondary | ICD-10-CM

## 2024-07-01 ENCOUNTER — Ambulatory Visit

## 2024-07-02 ENCOUNTER — Ambulatory Visit

## 2024-07-02 DIAGNOSIS — Z170 Estrogen receptor positive status [ER+]: Secondary | ICD-10-CM

## 2024-07-02 DIAGNOSIS — Z1231 Encounter for screening mammogram for malignant neoplasm of breast: Secondary | ICD-10-CM

## 2024-07-06 ENCOUNTER — Other Ambulatory Visit (INDEPENDENT_AMBULATORY_CARE_PROVIDER_SITE_OTHER)

## 2024-07-06 ENCOUNTER — Encounter: Payer: Self-pay | Admitting: Sports Medicine

## 2024-07-06 ENCOUNTER — Ambulatory Visit (INDEPENDENT_AMBULATORY_CARE_PROVIDER_SITE_OTHER): Admitting: Sports Medicine

## 2024-07-06 DIAGNOSIS — M25552 Pain in left hip: Secondary | ICD-10-CM

## 2024-07-06 MED ORDER — TRIAMCINOLONE ACETONIDE 40 MG/ML IJ SUSP
40.0000 mg | Freq: Once | INTRAMUSCULAR | Status: AC
  Administered 2024-07-06: 40 mg via INTRAMUSCULAR

## 2024-07-06 NOTE — Assessment & Plan Note (Addendum)
 Very pleasant 74 year old female, multifactorial left hip, refractory greater trochanteric bursitis, she does have some atrophy on imaging. She had failed multiple modalities in the past including physical therapy, steroid injections, Tenex, minimal benefit with Celebrex . Her last injection into the hip abductor tendon sheath/trochanteric bursa was in March of this year, now with a recurrence of pain. She has been working on hip abductor conditioning, we repeated the left greater trochanteric bursa injection today. We did discuss doing PRP but she is not interested, return to see me as needed. Of note they are going on a mission trip to [place] in a few days.

## 2024-07-06 NOTE — Progress Notes (Signed)
    Procedures performed today:    Procedure: Real-time Ultrasound Guided injection of the left trochanteric bursa/hip abductor sheath Device: Samsung HS60  Verbal informed consent obtained.  Time-out conducted.  Noted no overlying erythema, induration, or other signs of local infection.  Skin prepped in a sterile fashion.  Local anesthesia: Topical Ethyl chloride.  With sterile technique and under real time ultrasound guidance: Noted diminutive hip abductors, 1 cc Kenalog  40, 2 cc lidocaine, 2 cc bupivacaine injected easily Completed without difficulty  Advised to call if fevers/chills, erythema, induration, drainage, or persistent bleeding.  Images permanently stored and available for review in PACS.  Impression: Technically successful ultrasound guided injection.  Independent interpretation of notes and tests performed by another provider:   None.  Brief History, Exam, Impression, and Recommendations:    Left hip pain Very pleasant 74 year old female, multifactorial left hip, refractory greater trochanteric bursitis, she does have some atrophy on imaging. She had failed multiple modalities in the past including physical therapy, steroid injections, Tenex, minimal benefit with Celebrex . Her last injection into the hip abductor tendon sheath/trochanteric bursa was in March of this year, now with a recurrence of pain. She has been working on hip abductor conditioning, we repeated the left greater trochanteric bursa injection today. We did discuss doing PRP but she is not interested, return to see me as needed. Of note they are going on a mission trip to [place] in a few days.    ____________________________________________ Debby PARAS. Curtis, M.D., ABFM., CAQSM., AME. Primary Care and Sports Medicine [facility]  Adjunct Professor of [employer] Medicine  [employer]  Restaurant manager, fast food

## 2024-07-09 ENCOUNTER — Other Ambulatory Visit: Payer: Self-pay | Admitting: Oncology

## 2024-07-09 DIAGNOSIS — R928 Other abnormal and inconclusive findings on diagnostic imaging of breast: Secondary | ICD-10-CM

## 2024-07-10 ENCOUNTER — Ambulatory Visit
Admission: RE | Admit: 2024-07-10 | Discharge: 2024-07-10 | Disposition: A | Discharge status: Home / Self Care | Source: Ambulatory Visit | Attending: Oncology | Admitting: Oncology

## 2024-07-10 DIAGNOSIS — R928 Other abnormal and inconclusive findings on diagnostic imaging of breast: Secondary | ICD-10-CM

## 2024-07-20 ENCOUNTER — Ambulatory Visit: Payer: Self-pay

## 2024-07-20 NOTE — Telephone Encounter (Signed)
 FYI Only or Action Required?: FYI only for provider.  Patient was last seen in primary care on 07/06/2024 by Curtis Debby PARAS, MD.  Called Nurse Triage reporting No chief complaint on file..  Symptoms began yesterday.  Interventions attempted: Rest, hydration, or home remedies.  Symptoms are: gradually worsening.  Triage Disposition: Call PCP Within 24 Hours  Patient/caregiver understands and will follow disposition?: Yes   Copied from CRM [id number]. Topic: Clinical - Red Word Triage >> Jul 20, 2024  8:11 AM Kevelyn M wrote: Patient's just came back from [place] from a mission trip and could not stay for the remainder of the trip because her husband was experience diarrhea and dehydration. Patient is now back home and started having uncontrollable diarrhea around 3 am this morning. Reason for Disposition  Travel to a foreign country in past month  Answer Assessment - Initial Assessment Questions 1. DIARRHEA SEVERITY: How bad is the diarrhea? How many more stools have you had in the past 24 hours than normal?      4  2. ONSET: When did the diarrhea begin?      Yesterday  3. STOOL DESCRIPTION:  How loose or watery is the diarrhea? What is the stool color? Is there any blood or mucous in the stool?     Watery  4. VOMITING: Are you also vomiting? If Yes, ask: How many times in the past 24 hours?      No  5. ABDOMEN PAIN: Are you having any abdomen pain? If Yes, ask: What does it feel like? (e.g., crampy, dull, intermittent, constant)      Cramping Pain  6. ABDOMEN PAIN SEVERITY: If present, ask: How bad is the pain?  (e.g., Scale 1-10; mild, moderate, or severe)     2-3  7. ORAL INTAKE: If vomiting, Have you been able to drink liquids? How much liquids have you had in the past 24 hours?     Yes, has been drinking, able to tolerate oral fluids  8. HYDRATION: Any signs of dehydration? (e.g., dry mouth [not just dry lips], too weak to stand,  dizziness, new weight loss) When did you last urinate?     Dry Mouth  9. EXPOSURE: Have you traveled to a foreign country recently? Have you been exposed to anyone with diarrhea? Could you have eaten any food that was spoiled?     Yes, to Mr. Mast, Husband who'd been having diarrhea for a week.   10. ANTIBIOTIC USE: Are you taking antibiotics now or have you taken antibiotics in the past 2 months?       No  11. OTHER SYMPTOMS: Do you have any other symptoms? (e.g., fever, blood in stool)       Ear Congestion  12. PREGNANCY: Is there any chance you are pregnant? When was your last menstrual period?       No and No  Protocols used: Diarrhea-A-AH

## 2024-07-21 ENCOUNTER — Emergency Department (HOSPITAL_BASED_OUTPATIENT_CLINIC_OR_DEPARTMENT_OTHER)
Admission: EM | Admit: 2024-07-21 | Discharge: 2024-07-21 | Disposition: A | Discharge status: Home / Self Care | Attending: Emergency Medicine | Admitting: Emergency Medicine

## 2024-07-21 ENCOUNTER — Telehealth: Payer: Self-pay

## 2024-07-21 ENCOUNTER — Ambulatory Visit: Payer: Self-pay

## 2024-07-21 ENCOUNTER — Other Ambulatory Visit: Payer: Self-pay

## 2024-07-21 ENCOUNTER — Ambulatory Visit: Admitting: Physician Assistant

## 2024-07-21 DIAGNOSIS — R197 Diarrhea, unspecified: Secondary | ICD-10-CM | POA: Diagnosis present

## 2024-07-21 DIAGNOSIS — A09 Infectious gastroenteritis and colitis, unspecified: Secondary | ICD-10-CM | POA: Insufficient documentation

## 2024-07-21 LAB — COMPREHENSIVE METABOLIC PANEL WITH GFR
ALT: 23 U/L (ref 0–44)
AST: 30 U/L (ref 15–41)
Albumin: 3.9 g/dL (ref 3.5–5.0)
Alkaline Phosphatase: 79 U/L (ref 38–126)
Anion gap: 14 (ref 5–15)
BUN: 11 mg/dL (ref 8–23)
CO2: 20 mmol/L — ABNORMAL LOW (ref 22–32)
Calcium: 9 mg/dL (ref 8.9–10.3)
Chloride: 104 mmol/L (ref 98–111)
Creatinine, Ser: 0.9 mg/dL (ref 0.44–1.00)
GFR, Estimated: >60 mL/min (ref >60)
Glucose, Bld: 122 mg/dL — ABNORMAL HIGH (ref 70–99)
Potassium: 3.7 mmol/L (ref 3.5–5.1)
Sodium: 138 mmol/L (ref 135–145)
Total Bilirubin: 0.6 mg/dL (ref 0.0–1.2)
Total Protein: 6.8 g/dL (ref 6.5–8.1)

## 2024-07-21 LAB — CBC WITH DIFFERENTIAL/PLATELET
Abs Immature Granulocytes: 0.01 K/uL (ref 0.00–0.07)
Basophils Absolute: 0 K/uL (ref 0.0–0.1)
Basophils Relative: 0 %
Eosinophils Absolute: 0 K/uL (ref 0.0–0.5)
Eosinophils Relative: 1 %
HCT: 44 % (ref 36.0–46.0)
Hemoglobin: 15.1 g/dL — ABNORMAL HIGH (ref 12.0–15.0)
Immature Granulocytes: 0 %
Lymphocytes Relative: 12 %
Lymphs Abs: 0.8 K/uL (ref 0.7–4.0)
MCH: 29.3 pg (ref 26.0–34.0)
MCHC: 34.3 g/dL (ref 30.0–36.0)
MCV: 85.4 fL (ref 80.0–100.0)
Monocytes Absolute: 0.4 K/uL (ref 0.1–1.0)
Monocytes Relative: 7 %
Neutro Abs: 5.4 K/uL (ref 1.7–7.7)
Neutrophils Relative %: 80 %
Platelets: 181 K/uL (ref 150–400)
RBC: 5.15 MIL/uL — ABNORMAL HIGH (ref 3.87–5.11)
RDW: 13.1 % (ref 11.5–15.5)
WBC: 6.7 K/uL (ref 4.0–10.5)
nRBC: 0 % (ref 0.0–0.2)

## 2024-07-21 LAB — LIPASE, BLOOD: Lipase: 25 U/L (ref 11–51)

## 2024-07-21 LAB — MAGNESIUM: Magnesium: 1.8 mg/dL (ref 1.7–2.4)

## 2024-07-21 MED ORDER — SODIUM CHLORIDE 0.9 % IV BOLUS
1000.0000 mL | Freq: Once | INTRAVENOUS | Status: AC
  Administered 2024-07-21: 1000 mL via INTRAVENOUS

## 2024-07-21 MED ORDER — LOPERAMIDE HCL 2 MG PO CAPS
4.0000 mg | ORAL_CAPSULE | Freq: Once | ORAL | Status: AC
  Administered 2024-07-21: 4 mg via ORAL
  Filled 2024-07-21: qty 2

## 2024-07-21 MED ORDER — AZITHROMYCIN 250 MG PO TABS
500.0000 mg | ORAL_TABLET | Freq: Once | ORAL | Status: AC
  Administered 2024-07-21: 500 mg via ORAL
  Filled 2024-07-21: qty 2

## 2024-07-21 MED ORDER — AZITHROMYCIN 250 MG PO TABS
500.0000 mg | ORAL_TABLET | Freq: Every day | ORAL | 0 refills | Status: AC
Start: 2024-07-21 — End: 2024-07-23

## 2024-07-21 MED ORDER — ONDANSETRON HCL 4 MG/2ML IJ SOLN
4.0000 mg | Freq: Once | INTRAMUSCULAR | Status: AC
  Administered 2024-07-21: 4 mg via INTRAVENOUS
  Filled 2024-07-21: qty 2

## 2024-07-21 MED ORDER — MORPHINE SULFATE (PF) 2 MG/ML IV SOLN
2.0000 mg | Freq: Once | INTRAVENOUS | Status: AC
  Administered 2024-07-21: 2 mg via INTRAVENOUS
  Filled 2024-07-21: qty 1

## 2024-07-21 MED ORDER — ONDANSETRON 4 MG PO TBDP: ORAL_TABLET | ORAL | 0 refills | Status: DC

## 2024-07-21 NOTE — Discharge Instructions (Signed)
 Take Imodium  for diarrhea regularly for the next couple days.  Please return for worsening fatigue abdominal pain inability eat or drink.

## 2024-07-21 NOTE — ED Provider Notes (Signed)
 [place] [facility] AT [facility] Provider Note   CSN: [id number] Arrival date & time: 07/21/24  9180     Patient presents with: Diarrhea   Amanda Dickson is a 74 y.o. female.   74 yo F with a chief complaints of diarrhea.  This started couple days ago.  Patient just got back from [place].  Husband had a similar illness and was unfortunately sick on the trip.  He is doing a bit better.  She has had greater than 10 bowel movements a day.  Not dark not bloody.  No nausea or vomiting.  Abdominal cramping but no pain.   Diarrhea      Prior to Admission medications   Medication Sig Start Date End Date Taking? Authorizing Provider  azithromycin  (ZITHROMAX ) 250 MG tablet Take 2 tablets (500 mg total) by mouth daily for 2 days. 07/21/24 07/23/24 Yes Emil Share, DO  ondansetron  (ZOFRAN -ODT) 4 MG disintegrating tablet 4mg  ODT q4 hours prn nausea/vomit 07/21/24  Yes Mikayela Deats, DO  celecoxib  (CELEBREX ) 200 MG capsule TAKE ONE TO TWO CAPSULES BY MOUTH DAILY AS NEEDED FOR PAIN 06/29/24   Curtis Debby PARAS, MD  cholecalciferol (VITAMIN D3) 25 MCG (1000 UNIT) tablet Take 1,000 Units by mouth daily.    [provider]  Cyanocobalamin (VITAMIN B-12) 5000 MCG SUBL Take 5,000 mcg by mouth daily at 6 (six) AM. 01/24/18   [provider]  Fiber Adult Gummies 2 g CHEW Chew 2 each by mouth daily.    [provider]  levothyroxine  (SYNTHROID ) 25 MCG tablet Take in addition to 50mcg tablet once per week. 09/18/23   Frann Mabel Mt, DO  levothyroxine  (SYNTHROID ) 50 MCG tablet Take 1 tablet (50 mcg total) by mouth daily. 09/18/23   Wendling, Mabel Mt, DO  Misc Natural Products (BEET ROOT PO) Take by mouth.    [provider]  Multiple Vitamins-Minerals (MULTI-VITAMIN GUMMIES) CHEW Chew 2 tablets by mouth daily.    [provider]  typhoid (VIVOTIF) DR capsule Take 1 capsule by mouth every other day. 03/17/24   Frann Mabel Mt, DO    Allergies: Meperidine hcl    Review of Systems  Gastrointestinal:  Positive for diarrhea.    Updated Vital Signs BP 115/74   Pulse 67   Temp 98.1 F (36.7 C) (Oral)   Resp 20   Ht 5' 5 (1.651 m)   Wt 83.9 kg   SpO2 92%   BMI 30.79 kg/m   Physical Exam Vitals and nursing note reviewed.  Constitutional:      General: She is not in acute distress.    Appearance: She is well-developed. She is not diaphoretic.  HENT:     Head: Normocephalic and atraumatic.  Eyes:     Pupils: Pupils are equal, round, and reactive to light.  Cardiovascular:     Rate and Rhythm: Normal rate and regular rhythm.     Heart sounds: No murmur heard.    No friction rub. No gallop.  Pulmonary:     Effort: Pulmonary effort is normal.     Breath sounds: No wheezing or rales.  Abdominal:     General: There is no distension.     Palpations: Abdomen is soft.     Tenderness: There is no abdominal tenderness.     Comments: Benign abdominal exam  Musculoskeletal:        General: No tenderness.     Cervical back: Normal range of motion and neck supple.  Skin:  General: Skin is warm and dry.  Neurological:     Mental Status: She is alert and oriented to person, place, and time.  Psychiatric:        Behavior: Behavior normal.     (all labs ordered are listed, but only abnormal results are displayed) Labs Reviewed  CBC WITH DIFFERENTIAL/PLATELET - Abnormal; Notable for the following components:      Result Value   RBC 5.15 (*)    Hemoglobin 15.1 (*)    All other components within normal limits  COMPREHENSIVE METABOLIC PANEL WITH GFR - Abnormal; Notable for the following components:   CO2 20 (*)    Glucose, Bld 122 (*)    All other components within normal limits  GASTROINTESTINAL PANEL BY PCR, STOOL (REPLACES STOOL CULTURE)  LIPASE, BLOOD  MAGNESIUM    EKG: None  Radiology: No results found.   Procedures   Medications Ordered in the ED  sodium chloride  0.9 % bolus  1,000 mL (0 mLs Intravenous Stopped 07/21/24 0930)  ondansetron  (ZOFRAN ) injection 4 mg (4 mg Intravenous Given 07/21/24 0859)  morphine  (PF) 2 MG/ML injection 2 mg (2 mg Intravenous Given 07/21/24 0859)  loperamide  (IMODIUM ) capsule 4 mg (4 mg Oral Given 07/21/24 0843)  azithromycin  (ZITHROMAX ) tablet 500 mg (500 mg Oral Given 07/21/24 0843)                                    Medical Decision Making Amount and/or Complexity of Data Reviewed Labs: ordered.  Risk Prescription drug management.   74 yo F with a chief complaints of diarrhea.  Patient was recently in [place].  Her husband had a similar illness and required hospitalization while there.  I reviewed the current yellow book through the [facility].  Multiple possible diarrheal pathogens.  I will treat her presumptively for traveler's diarrhea.  Lab work here bolus of IV fluids reassess.  No significant electrolyte abnormalities, no significant renal dysfunction.  No acute anemia.  LFTs are unremarkable.  Patient is feeling a bit better after IV fluids.  Will discharge home.  PCP follow-up.  10:31 AM:  I have discussed the diagnosis/risks/treatment options with the patient.  Evaluation and diagnostic testing in the emergency department does not suggest an emergent condition requiring admission or immediate intervention beyond what has been performed at this time.  They will follow up with PCP. We also discussed returning to the ED immediately if new or worsening sx occur. We discussed the sx which are most concerning (e.g., sudden worsening pain, fever, inability to tolerate by mouth) that necessitate immediate return. Medications administered to the patient during their visit and any new prescriptions provided to the patient are listed below.  Medications given during this visit Medications  sodium chloride  0.9 % bolus 1,000 mL (0 mLs Intravenous Stopped 07/21/24 0930)  ondansetron  (ZOFRAN ) injection 4 mg (4 mg Intravenous Given 07/21/24  0859)  morphine  (PF) 2 MG/ML injection 2 mg (2 mg Intravenous Given 07/21/24 0859)  loperamide  (IMODIUM ) capsule 4 mg (4 mg Oral Given 07/21/24 0843)  azithromycin  (ZITHROMAX ) tablet 500 mg (500 mg Oral Given 07/21/24 0843)     The patient appears reasonably screen and/or stabilized for discharge and I doubt any other medical condition or other [facility] requiring further screening, evaluation, or treatment in the ED at this time prior to discharge.       Final diagnoses:  Traveler's diarrhea    ED Discharge  Orders          Ordered    azithromycin  (ZITHROMAX ) 250 MG tablet  Daily        07/21/24 1018    ondansetron  (ZOFRAN -ODT) 4 MG disintegrating tablet        07/21/24 1019               Emil Share, DO 07/21/24 1031

## 2024-07-21 NOTE — Telephone Encounter (Signed)
 FYI Only or Action Required?: FYI only for provider.  Patient was last seen in primary care on 07/06/2024 by Curtis Debby PARAS, MD.  Called Nurse Triage reporting Diarrhea.  Symptoms began 2 days ago.  Interventions attempted: Other: was seen in the ED today but symptoms have not improved.  Symptoms are: unchanged.  Triage Disposition: See Physician Within 24 Hours  Patient/caregiver understands and will follow disposition?: Yes    Copied from CRM [id number]. Topic: Clinical - Red Word Triage >> Jul 21, 2024 12:17 PM Thersia C wrote: [facility] that prompted transfer to Nurse Triage: Patient called in stated she has been having severe diarrhea , stated she went to the ER this morning and was prescribe some medication but it isnt doing much, not sure if she needs to wait or do something else would like some medical advice regarding this    Reason for Disposition  [1] SEVERE diarrhea (e.g., 7 or more times / day more than normal) AND [2] present > 24 hours (1 day)  Answer Assessment - Initial Assessment Questions 1. DIARRHEA SEVERITY: How bad is the diarrhea? How many more stools have you had in the past 24 hours than normal?      10 episodes a day approximately  2. ONSET: When did the diarrhea begin?      2 days ago  3. STOOL DESCRIPTION:  How loose or watery is the diarrhea? What is the stool color? Is there any blood or mucous in the stool?     Watery  4. VOMITING: Are you also vomiting? If Yes, ask: How many times in the past 24 hours?      No 5. ABDOMEN PAIN: Are you having any abdomen pain? If Yes, ask: What does it feel like? (e.g., crampy, dull, intermittent, constant)      Intermittent cramps 6. ABDOMEN PAIN SEVERITY: If present, ask: How bad is the pain?  (e.g., Scale 1-10; mild, moderate, or severe)     Mild 7. ORAL INTAKE: If vomiting, Have you been able to drink liquids? How much liquids have you had in the past 24 hours?     No 8.  HYDRATION: Any signs of dehydration? (e.g., dry mouth [not just dry lips], too weak to stand, dizziness, new weight loss) When did you last urinate?     Was feeling dehydrated, went to the ED and got IV fluids  9. EXPOSURE: Have you traveled to a foreign country recently? Have you been exposed to anyone with diarrhea? Could you have eaten any food that was spoiled?     Recently travelled to [place]  10. ANTIBIOTIC USE: Are you taking antibiotics now or have you taken antibiotics in the past 2 months?       No 11. OTHER SYMPTOMS: Do you have any other symptoms? (e.g., fever, blood in stool)       No  Protocols used: Diarrhea-A-AH

## 2024-07-21 NOTE — Telephone Encounter (Signed)
 Copied from CRM [id number]. Topic: Appointments - Scheduling Inquiry for Clinic >> Jul 21, 2024  7:52 AM Mesmerise C wrote: Reason for CRM: Patient stated that she's going to go to the ER due to non stop diarrhea has an appointment today at 3 doesn't know if she'll be able to make it or needs to follow up with the doctor

## 2024-07-21 NOTE — ED Notes (Signed)
 EDP in to see, at [facility].

## 2024-07-21 NOTE — ED Triage Notes (Signed)
 C/o diarrhea since 0200 Monday. Recent travel to [place]. Denies fever, abdominal pain, nausea/vomiting. Husband recently had similar symptoms. States >10 episodes a day.

## 2024-07-22 ENCOUNTER — Ambulatory Visit (INDEPENDENT_AMBULATORY_CARE_PROVIDER_SITE_OTHER): Admitting: Family Medicine

## 2024-07-22 ENCOUNTER — Encounter: Payer: Self-pay | Admitting: Family Medicine

## 2024-07-22 VITALS — BP 118/74 | HR 86 | Temp 97.5°F | Resp 16 | Ht 65.0 in | Wt 187.8 lb

## 2024-07-22 DIAGNOSIS — R197 Diarrhea, unspecified: Secondary | ICD-10-CM

## 2024-07-22 DIAGNOSIS — D582 Other hemoglobinopathies: Secondary | ICD-10-CM

## 2024-07-22 DIAGNOSIS — H6591 Unspecified nonsuppurative otitis media, right ear: Secondary | ICD-10-CM

## 2024-07-22 MED ORDER — PREDNISONE 20 MG PO TABS: 40.0000 mg | ORAL_TABLET | Freq: Every day | ORAL | 0 refills | Status: AC

## 2024-07-22 NOTE — Patient Instructions (Signed)
 We will recheck labs in 1 week.   Start the prednisone  for your ear.   Avoid Imodium  if able.   As long as you are vomiting or having diarrhea, please drink fluids with electrolytes like Gatorade, Powerade, or Pedialyte (if you can stomach the taste). Drink these along with water mainly.   If no better by tomorrow, complete the stool sample.   Let us  know if you need anything.

## 2024-07-22 NOTE — Progress Notes (Signed)
 Chief Complaint  Patient presents with   Diarrhea    ER Follow Up    Amanda Dickson is 74 y.o. female here for complaint of diarrhea.  Duration: 2 days Abdominal pain? No Bleeding? No Recent travel? Yes- [place] Recent antibiotic use? No Sick contacts? Yes- spouse Fevers? No Therapies tried: Imodium , started on azithromycin  yesterday She went to the emergency department.  They were unable to get a stool sample.  Other labs were largely normal.  Hemoglobin was 15.1, slightly elevated  She has had around 1 week of right ear fullness.  There is no pain, drainage, fevers, runny/stuffy nose.  She has not been using anything at home.  Past Medical History:  Diagnosis Date   Allergy    Arthritis Long ago!!   Breast cancer [facility]) 2013   Right, s/p lumpectomy with brachytherapy   Colon cancer (HCC) 2021   Essential hypertension 09/18/2023   Hypothyroidism     BP 118/74 (BP Location: Left Arm, Patient Position: Sitting)   Pulse 86   Temp (!) 97.5 F (36.4 C) (Oral)   Resp 16   Ht 5' 5 (1.651 m)   Wt 187 lb 12.8 oz (85.2 kg)   SpO2 95%   BMI 31.25 kg/m  Gen: awake, alert, appearing stated age HEENT: MMM, left canal 80% obstructed with cerumen, TM negative, right canal without obstruction or lesion, TM with serous fluid.  No erythema. Heart: RRR Lungs: CTAB, no accessory muscle use Abd: BS+, soft, no TTP, non-distended, no masses or organomegaly Psych: Age appropriate judgment and insight  Elevated hemoglobin (HCC) - Plan: CBC  Middle ear effusion, right - Plan: predniSONE  (DELTASONE ) 20 MG tablet  Diarrhea of presumed infectious origin - Plan: Gastrointestinal Panel by PCR , Stool  Likely due to acute dehydration.  Recheck next week.   5-day prednisone  burst 40 mg daily.  She has previously tolerated this well. Finish 3-day course of azithromycin .  If no improvement by tomorrow, she will complete a GI panel.  Encouraged adequate hydration with water and electrolyte  replacement.  If worsening symptoms, seek immediate care. The patient voiced understanding and agreement to the plan.  I spent 30 minutes with the patient discussing the above plans in addition to reviewing her chart on the same day of the visit.  Mabel Mt [place], DO 07/22/24 8:06 AM

## 2024-07-22 NOTE — Addendum Note (Signed)
 Addended by: MAREE HUM A on: 07/22/2024 10:00 AM   Modules accepted: Orders

## 2024-07-29 ENCOUNTER — Ambulatory Visit: Payer: Self-pay | Admitting: Family Medicine

## 2024-07-29 ENCOUNTER — Other Ambulatory Visit (INDEPENDENT_AMBULATORY_CARE_PROVIDER_SITE_OTHER)

## 2024-07-29 DIAGNOSIS — D582 Other hemoglobinopathies: Secondary | ICD-10-CM | POA: Diagnosis not present

## 2024-07-29 LAB — CBC
HCT: 42.2 % (ref 36.0–46.0)
Hemoglobin: 14.2 g/dL (ref 12.0–15.0)
MCHC: 33.6 g/dL (ref 30.0–36.0)
MCV: 86 fl (ref 78.0–100.0)
Platelets: 266 K/uL (ref 150.0–400.0)
RBC: 4.91 Mil/uL (ref 3.87–5.11)
RDW: 13.7 % (ref 11.5–15.5)
WBC: 7.5 K/uL (ref 4.0–10.5)

## 2024-08-05 ENCOUNTER — Encounter: Payer: Self-pay | Admitting: Gastroenterology

## 2024-09-01 ENCOUNTER — Encounter: Payer: Self-pay | Admitting: Sports Medicine

## 2024-09-05 ENCOUNTER — Other Ambulatory Visit: Payer: Self-pay

## 2024-09-05 ENCOUNTER — Emergency Department (HOSPITAL_BASED_OUTPATIENT_CLINIC_OR_DEPARTMENT_OTHER)
Admission: EM | Admit: 2024-09-05 | Discharge: 2024-09-05 | Disposition: A | Discharge status: Home / Self Care | Attending: Emergency Medicine | Admitting: Emergency Medicine

## 2024-09-05 ENCOUNTER — Emergency Department (HOSPITAL_BASED_OUTPATIENT_CLINIC_OR_DEPARTMENT_OTHER)

## 2024-09-05 ENCOUNTER — Encounter (HOSPITAL_BASED_OUTPATIENT_CLINIC_OR_DEPARTMENT_OTHER): Payer: Self-pay

## 2024-09-05 DIAGNOSIS — R079 Chest pain, unspecified: Secondary | ICD-10-CM | POA: Diagnosis present

## 2024-09-05 DIAGNOSIS — E876 Hypokalemia: Secondary | ICD-10-CM | POA: Insufficient documentation

## 2024-09-05 DIAGNOSIS — R0789 Other chest pain: Secondary | ICD-10-CM | POA: Insufficient documentation

## 2024-09-05 DIAGNOSIS — D72829 Elevated white blood cell count, unspecified: Secondary | ICD-10-CM | POA: Insufficient documentation

## 2024-09-05 DIAGNOSIS — I10 Essential (primary) hypertension: Secondary | ICD-10-CM | POA: Diagnosis not present

## 2024-09-05 LAB — BASIC METABOLIC PANEL WITH GFR
Anion gap: 12 (ref 5–15)
BUN: 18 mg/dL (ref 8–23)
CO2: 23 mmol/L (ref 22–32)
Calcium: 9.3 mg/dL (ref 8.9–10.3)
Chloride: 107 mmol/L (ref 98–111)
Creatinine, Ser: 1.02 mg/dL — ABNORMAL HIGH (ref 0.44–1.00)
GFR, Estimated: 57 mL/min — ABNORMAL LOW (ref >60)
Glucose, Bld: 113 mg/dL — ABNORMAL HIGH (ref 70–99)
Potassium: 3.3 mmol/L — ABNORMAL LOW (ref 3.5–5.1)
Sodium: 142 mmol/L (ref 135–145)

## 2024-09-05 LAB — CBC
HCT: 41.8 % (ref 36.0–46.0)
Hemoglobin: 14.1 g/dL (ref 12.0–15.0)
MCH: 29.2 pg (ref 26.0–34.0)
MCHC: 33.7 g/dL (ref 30.0–36.0)
MCV: 86.5 fL (ref 80.0–100.0)
Platelets: 215 K/uL (ref 150–400)
RBC: 4.83 MIL/uL (ref 3.87–5.11)
RDW: 13.8 % (ref 11.5–15.5)
WBC: 12.7 K/uL — ABNORMAL HIGH (ref 4.0–10.5)
nRBC: 0 % (ref 0.0–0.2)

## 2024-09-05 LAB — TROPONIN T, HIGH SENSITIVITY
Troponin T High Sensitivity: <15 ng/L (ref 0–19)
Troponin T High Sensitivity: <15 ng/L (ref 0–19)

## 2024-09-05 MED ORDER — ASPIRIN 81 MG PO CHEW
324.0000 mg | CHEWABLE_TABLET | Freq: Once | ORAL | Status: AC
  Administered 2024-09-05: 324 mg via ORAL
  Filled 2024-09-05: qty 4

## 2024-09-05 NOTE — ED Provider Notes (Signed)
 [place] [facility] Provider Note   CSN: [id number] Arrival date & time: 09/05/24  9244     Patient presents with: Chest Pain   Amanda Dickson is a 74 y.o. female.   HPI     74 year old female comes in with chief complaint of chest pain. Patient has past medical history of hypertension.  She has no known coronary artery disease.  Patient indicates that around 7:15 AM, she had midsternal chest discomfort described as tightness without any associated shortness of breath, nausea, vomiting, sweating.  The pain was nonradiating and lasted in moderate intensity for at least 15 to 30 minutes.  When she arrived to the ER, pain has slowly subsided.  Patient denies any recent chest pain with any exertion or shortness of breath.  Patient denies any history of PE, but states that she has had multiple trips recently including a trip to [place], drive to [place]  in [place] .  Currently she does not have any chest pain with deep inspiration.  Prior to Admission medications   Medication Sig Start Date End Date Taking? Authorizing Provider  celecoxib  (CELEBREX ) 200 MG capsule TAKE ONE TO TWO CAPSULES BY MOUTH DAILY AS NEEDED FOR PAIN 06/29/24   Curtis Debby PARAS, MD  cholecalciferol (VITAMIN D3) 25 MCG (1000 UNIT) tablet Take 1,000 Units by mouth daily.    [provider]  Cyanocobalamin (VITAMIN B-12) 5000 MCG SUBL Take 5,000 mcg by mouth daily at 6 (six) AM. 01/24/18   [provider]  Fiber Adult Gummies 2 g CHEW Chew 2 each by mouth daily.    [provider]  levothyroxine  (SYNTHROID ) 25 MCG tablet Take in addition to 50mcg tablet once per week. 09/18/23   Frann Mabel Mt, DO  levothyroxine  (SYNTHROID ) 50 MCG tablet Take 1 tablet (50 mcg total) by mouth daily. 09/18/23   Wendling, Mabel Mt, DO  Misc Natural Products (BEET ROOT PO) Take by mouth.    [provider]  Multiple Vitamins-Minerals  (MULTI-VITAMIN GUMMIES) CHEW Chew 2 tablets by mouth daily.    [provider]    Allergies: Meperidine hcl    Review of Systems  All other systems reviewed and are negative.   Updated Vital Signs BP 139/61   Pulse (!) 55   Temp 98.3 F (36.8 C) (Oral)   Resp 13   Wt 84.8 kg   SpO2 95%   BMI 31.12 kg/m   Physical Exam Vitals and nursing note reviewed. Exam conducted with a chaperone present.  Constitutional:      Appearance: She is well-developed.  HENT:     Head: Normocephalic and atraumatic.  Eyes:     Extraocular Movements: Extraocular movements intact.     Conjunctiva/sclera: Conjunctivae normal.  Cardiovascular:     Rate and Rhythm: Normal rate and regular rhythm.     Heart sounds: Normal heart sounds.  Pulmonary:     Effort: Pulmonary effort is normal. No respiratory distress.  Abdominal:     General: There is no distension.  Genitourinary:    Vagina: Normal.  Musculoskeletal:     Cervical back: Normal range of motion and neck supple.     Right lower leg: No tenderness. No edema.     Left lower leg: No tenderness. No edema.  Skin:    General: Skin is warm.  Neurological:     Mental Status: She is alert and oriented to person, place, and time.     (all labs ordered are listed, but only  abnormal results are displayed) Labs Reviewed  BASIC METABOLIC PANEL WITH GFR - Abnormal; Notable for the following components:      Result Value   Potassium 3.3 (*)    Glucose, Bld 113 (*)    Creatinine, Ser 1.02 (*)    GFR, Estimated 57 (*)    All other components within normal limits  CBC - Abnormal; Notable for the following components:   WBC 12.7 (*)    All other components within normal limits  TROPONIN T, HIGH SENSITIVITY  TROPONIN T, HIGH SENSITIVITY    EKG: EKG Interpretation Date/Time:  Saturday September 05 2024 08:05:00 EDT Ventricular Rate:  64 PR Interval:  139 QRS Duration:  86 QT Interval:  361 QTC Calculation: 373 R  Axis:   52  Text Interpretation: Sinus rhythm Abnormal R-wave progression, early transition Borderline T abnormalities, diffuse leads subtle ST depression in lateral leads No old tracing to compare avR has ST elevation Confirmed by Charlyn Sora [phone]) on 09/05/2024 8:48:37 AM   EKG Interpretation Date/Time:  Saturday September 05 2024 11:51:42 EDT Ventricular Rate:  61 PR Interval:  150 QRS Duration:  89 QT Interval:  391 QTC Calculation: 394 R Axis:   56  Text Interpretation: Sinus rhythm Abnormal R-wave progression, early transition No significant change since last tracing no concerning dynamic changes Confirmed by Charlyn Sora ([id number]) on 09/05/2024 11:54:54 AM         Radiology: [facility] Chest 2 View Result Date: 09/05/2024 CLINICAL DATA:  Chest pain. EXAM: CHEST - 2 VIEW COMPARISON:  None Available. FINDINGS: The heart size and mediastinal contours are within normal limits. Both lungs are clear. The visualized skeletal structures are unremarkable. Surgical clips in the right chest or axilla. IMPRESSION: No active cardiopulmonary disease. Electronically Signed   By: Juliene Balder M.D.   On: 09/05/2024 09:00     Procedures   Medications Ordered in the ED  aspirin  chewable tablet 324 mg (324 mg Oral Given 09/05/24 0858)                                    Medical Decision Making Amount and/or Complexity of Data Reviewed Labs: ordered. Radiology: ordered.  Risk OTC drugs.    This patient presents to the ED with chief complaint(s) of chest pain with pertinent past medical history of hypertension.The complaint involves an extensive differential diagnosis and also carries with it a high risk of complications and morbidity.    The differential diagnosis considered for this patient includes  ACS syndrome Aortic dissection CHF exacerbation Valvular disorder Pericardial effusion / tamponade Pneumonia Pleural effusion / Pulmonary edema PE Pneumothorax Musculoskeletal pain PUD  / Gastritis / Esophagitis Esophageal spasm  Currently patient is pain-free.  The pain was not pleuritic.  There were no specific GI symptoms associated with the pain, and not typical of cardiac symptoms either.  EKG has some subtle ST depression laterally with aVR and lead I ST elevation that is also subtle.  I have low suspicion for PE.  Pretest probability would be higher for ACS then any other conditions.  Patient has no tachycardia, tachypnea, hypoxia.  I did discuss return precautions for PE, but I do not think we will need to pursue that workup now.  The initial plan is to get basic labs, delta troponin, chest x-ray.   Additional history obtained: Additional history obtained from spouse Records reviewed PCP note reviewed.  No cardiac workup  noted.  Independent labs interpretation:  The following labs were independently interpreted: Troponin x 2 is normal.  CBC is reassuring. EKG shows no acute MI.  Subtle ST depression noted.  We will get repeat EKG.  Independent visualization and interpretation of imaging: - I independently visualized the following imaging with scope of interpretation limited to determining acute life threatening conditions related to emergency care: X-ray of the chest, which revealed no evidence of pneumothorax, cardiomegaly.  Treatment and Reassessment: Patient reassessed.  She continues to be comfortable, no evolution of pain.  Still has some nagging/residual discomfort right now.  Troponin x 2 normal.  At this time, I feel comfortable discharging the patient.  We will get repeat EKG to make sure there are no dynamic changes.  There are no EKGs in our system to compare.  Admission was considered, however in the setting of delta troponin being negative, patient having low to moderate heart score, and her being reliable, amenable to returning to the ER if her symptoms get worse, outpatient management is appropriate.   The patient appears reasonably screened  and/or stabilized for discharge and I doubt any other medical condition or other [facility] requiring further screening, evaluation, or treatment in the ED at this time prior to discharge.   Results from the ER workup discussed with the patient face to face and all questions answered to the best of my ability. The patient is safe for discharge with strict return precautions.   Final diagnoses:  Nonspecific chest pain    ED Discharge Orders          Ordered    Ambulatory referral to Cardiology       Comments: If you have not heard from the Cardiology office within the next 72 hours please call [phone].   09/05/24 1153               Charlyn Sora, MD 09/05/24 1155

## 2024-09-05 NOTE — Discharge Instructions (Addendum)
 We saw you in the ER for the chest pain/shortness of breath. All of our cardiac workup is normal, including labs, EKG and chest X-RAY are normal. We are not sure what is causing your discomfort, but we feel comfortable sending you home at this time. The workup in the ER is not complete, and you should follow up with cardiologist.  I have placed a cardiology referral.  Our hospital will contact you with a close follow-up.  Please start taking baby aspirin  daily.  Please return to the ER if you have worsening chest pain, shortness of breath, pain radiating to your jaw, shoulder, or back, sweats or fainting. Otherwise see the Cardiologist or your primary care doctor as requested.

## 2024-09-05 NOTE — ED Triage Notes (Signed)
 Dull mid chest pain since this morning.  Denies SHOB, NV, radiating pains

## 2024-09-08 ENCOUNTER — Ambulatory Visit: Attending: Cardiology | Admitting: Cardiology

## 2024-09-08 ENCOUNTER — Ambulatory Visit: Admitting: Cardiology

## 2024-09-08 VITALS — BP 132/82 | HR 67 | Ht 65.6 in | Wt 188.6 lb

## 2024-09-08 DIAGNOSIS — I10 Essential (primary) hypertension: Secondary | ICD-10-CM | POA: Diagnosis not present

## 2024-09-08 DIAGNOSIS — E785 Hyperlipidemia, unspecified: Secondary | ICD-10-CM | POA: Insufficient documentation

## 2024-09-08 DIAGNOSIS — R079 Chest pain, unspecified: Secondary | ICD-10-CM | POA: Insufficient documentation

## 2024-09-08 DIAGNOSIS — R072 Precordial pain: Secondary | ICD-10-CM | POA: Diagnosis present

## 2024-09-08 DIAGNOSIS — R7303 Prediabetes: Secondary | ICD-10-CM | POA: Diagnosis present

## 2024-09-08 DIAGNOSIS — E039 Hypothyroidism, unspecified: Secondary | ICD-10-CM | POA: Insufficient documentation

## 2024-09-08 DIAGNOSIS — R0609 Other forms of dyspnea: Secondary | ICD-10-CM | POA: Diagnosis present

## 2024-09-08 DIAGNOSIS — T7840XA Allergy, unspecified, initial encounter: Secondary | ICD-10-CM | POA: Insufficient documentation

## 2024-09-08 DIAGNOSIS — M1990 Unspecified osteoarthritis, unspecified site: Secondary | ICD-10-CM | POA: Insufficient documentation

## 2024-09-08 MED ORDER — METOPROLOL TARTRATE 100 MG PO TABS: ORAL_TABLET | ORAL | 0 refills | Status: DC

## 2024-09-08 MED ORDER — NITROGLYCERIN 0.4 MG SL SUBL: 0.4000 mg | SUBLINGUAL_TABLET | SUBLINGUAL | 6 refills | Status: DC | PRN

## 2024-09-08 NOTE — Patient Instructions (Signed)
 Medication Instructions:  START: Nitroglycerin - Use nitroglycerin  1 tablet placed under the tongue at the first sign of chest pain or an angina attack. 1 tablet may be used every 5 minutes as needed, for up to 15 minutes. Do not take more than 3 tablets in 15 minutes. If pain persist call 911 or go to the nearest ED.    TAKE: Metoprolol  100mg  1 tablet 2 hours prior to CT scan   Lab Work: None Ordered If you have labs (blood work) drawn today and your tests are completely normal, you will receive your results only by: MyChart Message (if you have MyChart) OR A paper copy in the mail If you have any lab test that is abnormal or we need to change your treatment, we will call you to review the results.   Testing/Procedures:  Your physician has requested that you have an echocardiogram. Echocardiography is a painless test that uses sound waves to create images of your heart. It provides your doctor with information about the size and shape of your heart and how well your heart's chambers and valves are working. This procedure takes approximately one hour. There are no restrictions for this procedure. Please do NOT wear cologne, perfume, aftershave, or lotions (deodorant is allowed). Please arrive 15 minutes prior to your appointment time.  Please note: We ask at that you not bring children with you during ultrasound (echo/ vascular) testing. Due to room size and safety concerns, children are not allowed in the ultrasound rooms during exams. Our front office staff cannot provide observation of children in our lobby area while testing is being conducted. An adult accompanying a patient to their appointment will only be allowed in the ultrasound room at the discretion of the ultrasound technician under special circumstances. We apologize for any inconvenience.   Your cardiac CT will be scheduled at one of the below locations:   [facility] [address] [place], [place]  [address]  Please follow these instructions carefully (unless otherwise directed):    On the Night Before the Test: Be sure to Drink plenty of water. Do not consume any caffeinated/decaffeinated beverages or chocolate 12 hours prior to your test. Do not take any antihistamines 12 hours prior to your test.   On the Day of the Test: Drink plenty of water until 1 hour prior to the test. Do not eat any food 4 hours prior to the test. You may take your regular medications prior to the test.  Take metoprolol  (Lopressor ) two hours prior to test. FEMALES- please wear underwire-free bra if available, avoid dresses & tight clothing        After the Test: Drink plenty of water. After receiving IV contrast, you may experience a mild flushed feeling. This is normal. On occasion, you may experience a mild rash up to 24 hours after the test. This is not dangerous. If this occurs, you can take Benadryl 25 mg and increase your fluid intake. If you experience trouble breathing, this can be serious. If it is severe call 911 IMMEDIATELY. If it is mild, please call our office. If you take any of these medications: Glipizide/Metformin, Avandament, Glucavance, please do not take 48 hours after completing test unless otherwise instructed.  We will call to schedule your test 2-4 weeks out understanding that some insurance companies will need an authorization prior to the service being performed.   For non-scheduling related questions, please contact the cardiac imaging nurse navigator should you have any questions/concerns: Camie Shutter,  Cardiac Imaging Nurse Navigator Chantal Requena, Cardiac Imaging Nurse Navigator  Heart and Vascular Services Direct Office Dial: [phone]   For scheduling needs, including cancellations and rescheduling, please call [place], [phone].    Follow-Up: At [facility], you and your health needs are our priority.  As part of our continuing mission to  provide you with exceptional heart care, we have created designated Provider Care Teams.  These Care Teams include your primary Cardiologist (physician) and Advanced Practice Providers (APPs -  Physician Assistants and Nurse Practitioners) who all work together to provide you with the care you need, when you need it.  We recommend signing up for the patient portal called MyChart.  Sign up information is provided on this After Visit Summary.  MyChart is used to connect with patients for Virtual Visits (Telemedicine).  Patients are able to view lab/test results, encounter notes, upcoming appointments, etc.  Non-urgent messages can be sent to your provider as well.   To learn more about what you can do with MyChart, go to [email or web address].    Your next appointment:   2 month(s)  The format for your next appointment:   In Person  Provider:   Lamar Fitch, MD    Other Instructions NA

## 2024-09-08 NOTE — Progress Notes (Signed)
 Cardiology Consultation:    Date:  09/08/2024   ID:  Raeann Hurdle, DOB Jul 25, 1950, MRN [id number]  PCP:  Frann Mabel Mt, DO  Cardiologist:  Lamar Fitch, MD   Referring MD: Charlyn Sora, MD   No chief complaint on file. I had a chest pain  History of Present Illness:    Amanda Dickson is a 74 y.o. female who is being seen today for the evaluation of chest pain at the request of Charlyn Sora, MD. past medical history significant for dyslipidemia, essential hypertension, hypothyroidism, borderline diabetes, history of breast CVA in 2013, colon cancer in 2021.  3 days ago she was driving with her husband to [place] suddenly started having tightness in the chest there is no sweating no palpitations no shortness of breath associated with this sensation radiation towards the right side of her neck.  She waited 10 to 15 minutes on the side of the road did not get better she ended up going to the emergency room.  Upon arrival to the emergency room pain was almost gone.  Troponins were negative EKG did not show any acute changes she was discharged home after evaluation with instruction to follow-up with us .  She was asked to start taking 1 baby aspirin  every single day.  Since that time she is doing great.  Denies have any chest pain tightness squeezing pressure burning chest.  She does not smoke never did she does have some family history of coronary artery disease, not premature, not on any special diet overall very active and doing quite well.  Since the time of her chest pain which was 3 days ago she was advised not to exercise.  Past Medical History:  Diagnosis Date   Acquired hypothyroidism 10/23/2021   Allergy    Arthritis Long ago!!   Breast cancer [facility]) 2013   Right, s/p lumpectomy with brachytherapy   Cervical spondylosis 04/23/2022   Colon cancer (HCC) 2021   Dyslipidemia 09/08/2024   Elevated glucose 05/24/2022   Essential hypertension 09/18/2023   History of  breast cancer 10/23/2021   History of colon cancer 10/23/2021   History of colon resection 10/23/2021   Hot flashes 09/18/2023   Hypothyroidism    Insomnia 09/18/2023   Left hip pain 10/18/2021   Lumbar spondylosis 11/05/2022   Post-menopausal 11/28/2022   Prediabetes 06/05/2023    Past Surgical History:  Procedure Laterality Date   APPENDECTOMY  2021   part of colon removal   BREAST SURGERY  2013   Rt. Lumpectomy   CHOLECYSTECTOMY     COLON SURGERY  09-12-2020   COLONOSCOPY  09/06/2020   Dr. Layla Gave - [facility]. Moderate diverticulosis of the sigmoid colon, Mass (3cm) in the proximal ascending colon.   COLONOSCOPY  10/16/2010   Dr. Layla Gave - [place]  [facility]. Moderate diverticulosis sigmoid colon, grade 1 internal hemorrhoids   HERNIA REPAIR     TONSILLECTOMY     TUBAL LIGATION      Current Medications: Current Meds  Medication Sig   aspirin  EC 81 MG tablet Take 81 mg by mouth daily.   celecoxib  (CELEBREX ) 200 MG capsule TAKE ONE TO TWO CAPSULES BY MOUTH DAILY AS NEEDED FOR PAIN   cholecalciferol (VITAMIN D3) 25 MCG (1000 UNIT) tablet Take 1,000 Units by mouth daily.   Cyanocobalamin (VITAMIN B-12) 5000 MCG SUBL Take 5,000 mcg by mouth daily at 6 (six) AM.   Fiber Adult Gummies 2 g CHEW Chew 2 each by mouth daily.  levothyroxine  (SYNTHROID ) 25 MCG tablet Take in addition to 50mcg tablet once per week.   levothyroxine  (SYNTHROID ) 50 MCG tablet Take 1 tablet (50 mcg total) by mouth daily.   Misc Natural Products (BEET ROOT PO) Take by mouth.   Multiple Vitamins-Minerals (MULTI-VITAMIN GUMMIES) CHEW Chew 2 tablets by mouth daily.     Allergies:   Meperidine hcl   Social History   Socioeconomic History   Marital status: Married    Spouse name: Yared Susan   Number of children: 2   Years of education: 16   Highest education level: Bachelor's degree (e.g., BA, AB, BS)  Occupational History   Occupation: Retired   Occupation:  Retired  Tobacco Use   Smoking status: Never    Passive exposure: Never   Smokeless tobacco: Never  Vaping Use   Vaping status: Never Used  Substance and Sexual Activity   Alcohol use: Never   Drug use: Never   Sexual activity: Yes    Partners: Male    Birth control/protection: Post-menopausal  Other Topics Concern   Not on file  Social History Narrative   Lives with her husband. She enjoys sewing, crocheting and travelling.   Social Drivers of Corporate investment banker Strain: Low Risk  (06/12/2024)   Overall Financial Resource Strain (CARDIA)    Difficulty of Paying Living Expenses: Not hard at all  Food Insecurity: No Food Insecurity (06/12/2024)   Hunger Vital Sign    Worried About Running Out of Food in the Last Year: Never true    Ran Out of Food in the Last Year: Never true  Transportation Needs: No Transportation Needs (06/12/2024)   PRAPARE - Administrator, Civil Service (Medical): No    Lack of Transportation (Non-Medical): No  Physical Activity: Inactive (06/12/2024)   Exercise Vital Sign    Days of Exercise per Week: 0 days    Minutes of Exercise per Session: Not on file  Stress: No Stress Concern Present (06/12/2024)   [employer] of Occupational Health - Occupational Stress Questionnaire    Feeling of Stress: Not at all  Social Connections: Socially Integrated (06/12/2024)   Social Connection and Isolation Panel    Frequency of Communication with Friends and Family: More than three times a week    Frequency of Social Gatherings with Friends and Family: More than three times a week    Attends Religious Services: More than 4 times per year    Active Member of [employer] or Organizations: Yes    Attends Engineer, structural: More than 4 times per year    Marital Status: Married     Family History: The patient's family history includes Arthritis in her mother; Cancer in her brother, father, maternal grandfather, and mother; Colon cancer in  her father and maternal grandfather; Crohn's disease in her maternal aunt; Liver cancer in her father; Melanoma in her mother; Prostate cancer in her father; Vision loss in her mother. There is no history of Rectal cancer, Esophageal cancer, Pancreatic cancer, or Stomach cancer. ROS:   Please see the history of present illness.    All 14 point review of systems negative except as described per history of present illness.  EKGs/Labs/Other Studies Reviewed:    The following studies were reviewed today:   EKG:  EKG Interpretation Date/Time:  Tuesday September 08 2024 12:58:24 EDT Ventricular Rate:  67 PR Interval:  130 QRS Duration:  82 QT Interval:  366 QTC Calculation: 386 R Axis:  59  Text Interpretation: Normal sinus rhythm Nonspecific ST abnormality Abnormal ECG When compared with ECG of 05-Sep-2024 11:51, PREVIOUS ECG IS PRESENT Confirmed by Bernie Charleston [phone]) on 09/08/2024 1:06:18 PM    Recent Labs: 06/15/2024: TSH 1.73 07/21/2024: ALT 23; Magnesium 1.8 09/05/2024: BUN 18; Creatinine, Ser 1.02; Hemoglobin 14.1; Platelets 215; Potassium 3.3; Sodium 142  Recent Lipid Panel    Component Value Date/Time   CHOL 202 (H) 06/15/2024 0724   TRIG 151 (H) 06/15/2024 0724   HDL 66 06/15/2024 0724   CHOLHDL 3.1 06/15/2024 0724   VLDL 26 11/29/2022 0850   LDLCALC 110 (H) 06/15/2024 0724    Physical Exam:    VS:  BP 132/82   Pulse 67   Ht 5' 5.6 (1.666 m)   Wt 188 lb 9.6 oz (85.5 kg)   SpO2 97%   BMI 30.81 kg/m     Wt Readings from Last 3 Encounters:  09/08/24 188 lb 9.6 oz (85.5 kg)  09/05/24 187 lb (84.8 kg)  07/22/24 187 lb 12.8 oz (85.2 kg)     GEN:  Well nourished, well developed in no acute distress HEENT: Normal NECK: No JVD; No carotid bruits LYMPHATICS: No lymphadenopathy CARDIAC: RRR, no murmurs, no rubs, no gallops RESPIRATORY:  Clear to auscultation without rales, wheezing or rhonchi  ABDOMEN: Soft, non-tender, non-distended MUSCULOSKELETAL:  No edema;  No deformity  SKIN: Warm and dry NEUROLOGIC:  Alert and oriented x 3 PSYCHIATRIC:  Normal affect   ASSESSMENT:    1. Essential hypertension   2. Chest pain of uncertain etiology   3. Prediabetes   4. Dyslipidemia    PLAN:    In order of problems listed above:  Chest pain uncertain etiology.  Troponins were negative I did review emergency room record, EKG shows some nonspecific changes.  She is already on baby aspirin  which I continue to give her nitroglycerin  as needed and give her instruction how to take it.  I will schedule her to have coronary CT angio explained to her procedure we will do it.  A supportive evaluation echocardiogram will be done to assess left ventricular ejection fraction as well as to make sure that there is no valvular pathology. Dyslipidemia I did review K PN which show me her LDL of 110 HDL 66.  Will wait for results of coronary CT angio to determine what will be her target LDL. Prediabetes is a problem hemoglobin A1c 6.4.  She understands she need to be [place] more active but I caution her about pushing herself heart until we get her coronary CT angio.   Medication Adjustments/Labs and Tests Ordered: Current medicines are reviewed at length with the patient today.  Concerns regarding medicines are outlined above.  Orders Placed This Encounter  Procedures   EKG 12-Lead   No orders of the defined types were placed in this encounter.   Signed, [place] DOROTHA Bernie, MD, [facility]. 09/08/2024 1:37 PM    [facility]

## 2024-09-08 NOTE — Addendum Note (Signed)
 Addended by: ARLOA PLANAS D on: 09/08/2024 01:45 PM   Modules accepted: Orders

## 2024-09-22 ENCOUNTER — Encounter: Payer: Self-pay | Admitting: Gastroenterology

## 2024-09-22 ENCOUNTER — Ambulatory Visit (AMBULATORY_SURGERY_CENTER): Admitting: *Deleted

## 2024-09-22 ENCOUNTER — Encounter (HOSPITAL_COMMUNITY): Payer: Self-pay

## 2024-09-22 ENCOUNTER — Telehealth: Payer: Self-pay

## 2024-09-22 VITALS — Ht 65.5 in | Wt 186.0 lb

## 2024-09-22 DIAGNOSIS — Z800 Family history of malignant neoplasm of digestive organs: Secondary | ICD-10-CM

## 2024-09-22 DIAGNOSIS — Z85038 Personal history of other malignant neoplasm of large intestine: Secondary | ICD-10-CM

## 2024-09-22 MED ORDER — PLENVU 140 G PO SOLR: 1.0000 | ORAL | 0 refills | Status: DC

## 2024-09-22 NOTE — Telephone Encounter (Signed)
 Per patient request prep changed to Split dose Miralax for colonoscopy on 10/12/2024. Reviewed new prep instructions with patient.  New prep instructions sent via MyChart and mailed to patient.  Patient verbalizes understanding. All questions answered.

## 2024-09-22 NOTE — Progress Notes (Signed)
 Pt's name and DOB verified at the beginning of the pre-visit with 2 identifiers  Pt denies any difficulty with ambulating,sitting, laying down or rolling side to side  Pt has no issues moving head neck or swallowing  No egg or soy allergy known to patient   No issues known to pt with past sedation  No FH of Malignant Hyperthermia  Pt is not on home 02   Pt is not on blood thinners   Pt denies issues with constipation   Pt is not on dialysis  Pt denise any abnormal heart rhythms  Has Echo on 10/2nd due to recent chest pain on Sept 6th.Seen in ER and did not find any reason for and saw Card MD who scheduled Echo. Instructed to   Patient's chart reviewed by Norleen Schillings CNRA prior to pre-visit and patient appropriate for the [facility].  Pre-visit completed and red dot placed by patient's name on their procedure day (on provider's schedule).    Visit by phone  Pt states weight is 186 lb   Pt given  both [facility] main # and MD on call # prior to instructions.  Informed pt to come in at the time discussed and is shown on PV instructions.  Pt instructed to use Singlecare.com or GoodRx for a price reduction on prep  Instructed pt where to find PV instructions in My Chart  Instructed pt on all aspects of written instructions including med holds clothing to wear and foods to eat and not eat as well as after procedure legal restrictions and to call MD on call if needed.. Pt states understanding. Instructed pt to review instructions again prior to procedure and call main # given if has any questions or any issues. Pt states they will.

## 2024-09-23 ENCOUNTER — Encounter (HOSPITAL_COMMUNITY): Payer: Self-pay

## 2024-09-29 ENCOUNTER — Telehealth (HOSPITAL_COMMUNITY): Payer: Self-pay | Admitting: *Deleted

## 2024-09-29 NOTE — Telephone Encounter (Signed)
 Reaching out to patient to offer assistance regarding upcoming cardiac imaging study; pt verbalizes understanding of appt date/time, parking situation and where to check in, pre-test NPO status and medications ordered, and verified current allergies; name and call back number provided for further questions should they arise  Chantal Requena RN Navigator Cardiac Imaging [facility] [phone] office [phone] cell

## 2024-09-30 ENCOUNTER — Ambulatory Visit (HOSPITAL_COMMUNITY)
Admission: RE | Admit: 2024-09-30 | Discharge: 2024-09-30 | Disposition: A | Discharge status: Home / Self Care | Source: Ambulatory Visit | Attending: Cardiology | Admitting: Cardiology

## 2024-09-30 DIAGNOSIS — R072 Precordial pain: Secondary | ICD-10-CM | POA: Insufficient documentation

## 2024-09-30 MED ORDER — IOHEXOL 350 MG/ML SOLN
100.0000 mL | Freq: Once | INTRAVENOUS | Status: AC | PRN
  Administered 2024-09-30: 100 mL via INTRAVENOUS

## 2024-09-30 MED ORDER — NITROGLYCERIN 0.4 MG SL SUBL
0.8000 mg | SUBLINGUAL_TABLET | Freq: Once | SUBLINGUAL | Status: AC
  Administered 2024-09-30: 0.8 mg via SUBLINGUAL

## 2024-10-01 ENCOUNTER — Inpatient Hospital Stay: Attending: Oncology | Admitting: Oncology

## 2024-10-01 ENCOUNTER — Ambulatory Visit (INDEPENDENT_AMBULATORY_CARE_PROVIDER_SITE_OTHER)

## 2024-10-01 VITALS — BP 136/77 | HR 71 | Temp 97.9°F | Resp 16 | Wt 188.6 lb

## 2024-10-01 DIAGNOSIS — Z170 Estrogen receptor positive status [ER+]: Secondary | ICD-10-CM

## 2024-10-01 DIAGNOSIS — R0609 Other forms of dyspnea: Secondary | ICD-10-CM | POA: Insufficient documentation

## 2024-10-01 DIAGNOSIS — Z853 Personal history of malignant neoplasm of breast: Secondary | ICD-10-CM | POA: Diagnosis present

## 2024-10-01 DIAGNOSIS — Z85038 Personal history of other malignant neoplasm of large intestine: Secondary | ICD-10-CM | POA: Insufficient documentation

## 2024-10-01 DIAGNOSIS — E039 Hypothyroidism, unspecified: Secondary | ICD-10-CM | POA: Diagnosis not present

## 2024-10-01 DIAGNOSIS — Z08 Encounter for follow-up examination after completed treatment for malignant neoplasm: Secondary | ICD-10-CM | POA: Insufficient documentation

## 2024-10-01 DIAGNOSIS — C50411 Malignant neoplasm of upper-outer quadrant of right female breast: Secondary | ICD-10-CM

## 2024-10-01 LAB — ECHOCARDIOGRAM COMPLETE
Area-P 1/2: 3.06 cm2
S' Lateral: 2.8 cm
Weight: 3017.6 [oz_av]

## 2024-10-01 NOTE — Progress Notes (Signed)
 [facility] OFFICE PROGRESS NOTE   Diagnosis:   INTERVAL HISTORY:   Amanda Dickson turns as scheduled.  She feels well.  She reports intermittent constipation and diarrhea.  She had an episode of bright red blood with stool several months ago.  She is scheduled for colonoscopy in 2 weeks.  A screening mammogram 07/02/2024 revealed a possible mass in the left breast.  A diagnostic mammogram and ultrasound 07/10/2024 confirmed a cyst correlating with the mammographic finding.  Objective:  Vital signs in last 24 hours:  Blood pressure 136/77, pulse 71, temperature 97.9 F (36.6 C), temperature source Temporal, resp. rate 16, weight 188 lb 9.6 oz (85.5 kg), SpO2 100%.  Lymphatics: No cervical, supraclavicular, axillary, or inguinal nodes Resp: Lungs clear bilaterally Cardio: Regular rate and rhythm GI: No hepatosplenomegaly, nontender, no mass Vascular: No leg edema Breasts: Firm tissue surrounding the right lumpectomy scar.  No mass in either breast.  Lab Results:  Lab Results  Component Value Date   WBC 12.7 (H) 09/05/2024   HGB 14.1 09/05/2024   HCT 41.8 09/05/2024   MCV 86.5 09/05/2024   PLT 215 09/05/2024   NEUTROABS 5.4 07/21/2024    CMP  Lab Results  Component Value Date   NA 142 09/05/2024   K 3.3 (L) 09/05/2024   CL 107 09/05/2024   CO2 23 09/05/2024   GLUCOSE 113 (H) 09/05/2024   BUN 18 09/05/2024   CREATININE 1.02 (H) 09/05/2024   CALCIUM 9.3 09/05/2024   PROT 6.8 07/21/2024   ALBUMIN 3.9 07/21/2024   AST 30 07/21/2024   ALT 23 07/21/2024   ALKPHOS 79 07/21/2024   BILITOT 0.6 07/21/2024   GFRNONAA 57 (L) 09/05/2024    No results found for: CEA1, CEA, CAN199, CA125  No results found for: INR, LABPROT  Imaging:  CT CORONARY MORPH W/CTA COR W/SCORE W/CA W/CM &/OR WO/CM Result Date: 09/30/2024 CLINICAL DATA:  Chest pain EXAM: Cardiac/Coronary CTA TECHNIQUE: A non-contrast, gated CT scan was obtained with axial slices of 2.5 mm  through the heart for calcium scoring. Calcium scoring was performed using the Agatston method. A 100 kV prospective, gated, contrast cardiac CT scan was obtained. Gantry rotation speed was 230 msec and collimation was 0.63 mm. Two sublingual nitroglycerin  tablets (0.8 mg) were given. The 3D data set was reconstructed with motion correction for the best systolic or diastolic phase. Images were analyzed on a dedicated workstation using MPR, MIP, and VRT modes. The patient received 95 cc of contrast. FINDINGS: Image quality: Excellent. Noise artifact is: Limited. Coronary Arteries:  Normal coronary origin.  Right dominance. Left main: The left main is a large caliber vessel with a normal take off from the left coronary cusp that trifurcates to form a left anterior descending artery, ramus, and a left circumflex artery. There is no plaque or stenosis. Left anterior descending artery: The LAD is patent without evidence of plaque or stenosis. The LAD gives off 1 patent diagonal branch. Ramus intermedius: Large vessel that is patent with no evidence of plaque or stenosis. Left circumflex artery: The LCX is non-dominant and patent with no evidence of plaque or stenosis. Right coronary artery: The RCA is dominant with normal take off from the right coronary cusp. There is minimal non-calcified plaque (<25%) in the proximal RCA. Mid and distal segments are patent. The RCA terminates as a PDA and right posterolateral branch without evidence of plaque or stenosis. Right Atrium: Right atrial size is within normal limits. Right Ventricle: The right ventricular  cavity is within normal limits. Left Atrium: Left atrial size is normal in size with no left atrial appendage filling defect. Left Ventricle: The ventricular cavity size is within normal limits. Pulmonary arteries: Normal in size. Pulmonary veins: Normal pulmonary venous drainage. Pericardium: Normal thickness without significant effusion or calcium present. Cardiac  valves: The aortic valve is trileaflet without significant calcification. The mitral valve is normal without significant calcification. Aorta: Normal caliber without significant disease. Extra-cardiac findings: See attached radiology report for non-cardiac structures. IMPRESSION: 1. Coronary calcium score of 0. 2. Normal coronary origin with right dominance. 3. Minimal non-calcified plaque (<25%) in the proximal RCA. RECOMMENDATIONS: 1. CAD-RADS 1: Minimal non-obstructive CAD (0-24%). Consider non-atherosclerotic causes of chest pain. Consider preventive therapy and risk factor modification. Darryle Decent, MD Electronically Signed   By: Darryle Decent M.D.   On: 09/30/2024 12:26    Medications: I have reviewed the patient's current medications.   Assessment/Plan: Right breast cancer-stage Ia (T1N0), status post a right lumpectomy and sentinel lymph node biopsy March 2013 ER 100%, PR 99%, HER2 0 Adjuvant brachytherapy March 2013 Adjuvant aromatase inhibitors including Femara, Aromasin, and Arimidex March 2013-March 2015-discontinued secondary to severe hot flashes and rash, trial of tamoxifen discontinued secondary to severe hot flashes Right colon cancer, stage I, T1N0-status post a right colectomy 09/12/2020, tumor invaded the submucosa, 0/12 lymph nodes, no lymphovascular perineural invasion, mismatch repair protein proficient CT abdomen/pelvis negative for metastases Colonoscopy 09/22/2021-negative for polyps  3.   Negative INVITAE genetic panel 4.   Hypothyroidism 5.   G2 P2     Disposition: Amanda Dickson is in clinical remission from breast cancer and colon cancer.  She has a good prognosis for a long-term disease-free survival from both the breast and colon cancers.  She is scheduled for a surveillance colonoscopy 10/12/2024.  She will follow-up with Dr. San for evaluation of rectal bleeding and ongoing colon surveillance.  She will continue yearly mammography.  She plans to  continue clinical follow-up with Dr. Frann.  She is not scheduled for a follow-up appointment at the cancer center.  I am available to see her as needed.  Arley Hof, MD  10/01/2024  9:57 AM

## 2024-10-02 ENCOUNTER — Ambulatory Visit: Payer: Self-pay | Admitting: Cardiology

## 2024-10-02 ENCOUNTER — Ambulatory Visit: Payer: Medicare Other | Admitting: Oncology

## 2024-10-05 ENCOUNTER — Telehealth: Payer: Self-pay

## 2024-10-05 NOTE — Telephone Encounter (Signed)
 Left message on My Chart with CT Angio results per Dr. Karry note. Routed to PCP.

## 2024-10-05 NOTE — Telephone Encounter (Signed)
 Left message on My Chart with Echo results per Dr. Karry note. Routed to PCP.

## 2024-10-06 ENCOUNTER — Telehealth: Payer: Self-pay

## 2024-10-06 ENCOUNTER — Encounter: Payer: Self-pay | Admitting: Gastroenterology

## 2024-10-06 NOTE — Telephone Encounter (Signed)
 Pt viewed Echo results on My Chart per Dr. Vanetta Shawl note. Routed to PCP.

## 2024-10-12 ENCOUNTER — Encounter: Payer: Self-pay | Admitting: Gastroenterology

## 2024-10-12 ENCOUNTER — Ambulatory Visit: Admitting: Gastroenterology

## 2024-10-12 VITALS — BP 134/80 | HR 73 | Temp 96.4°F | Resp 10 | Ht 65.0 in | Wt 186.0 lb

## 2024-10-12 DIAGNOSIS — K641 Second degree hemorrhoids: Secondary | ICD-10-CM

## 2024-10-12 DIAGNOSIS — K5730 Diverticulosis of large intestine without perforation or abscess without bleeding: Secondary | ICD-10-CM | POA: Diagnosis not present

## 2024-10-12 DIAGNOSIS — Z1211 Encounter for screening for malignant neoplasm of colon: Secondary | ICD-10-CM

## 2024-10-12 DIAGNOSIS — K644 Residual hemorrhoidal skin tags: Secondary | ICD-10-CM

## 2024-10-12 DIAGNOSIS — Z980 Intestinal bypass and anastomosis status: Secondary | ICD-10-CM

## 2024-10-12 DIAGNOSIS — Z85038 Personal history of other malignant neoplasm of large intestine: Secondary | ICD-10-CM

## 2024-10-12 MED ORDER — SODIUM CHLORIDE 0.9 % IV SOLN: 500.0000 mL | Freq: Once | INTRAVENOUS | Status: DC

## 2024-10-12 NOTE — Progress Notes (Signed)
 GASTROENTEROLOGY PROCEDURE H&P NOTE   Primary Care Physician: Frann Mabel Mt, DO    Reason for Procedure:  Colon cancer surveillance  Plan:    Colonoscopy  Patient is appropriate for endoscopic procedure(s) in the ambulatory ([facility]) setting.  The nature of the procedure, as well as the risks, benefits, and alternatives were carefully and thoroughly reviewed with the patient. Ample time for discussion and questions allowed. The patient understood, was satisfied, and agreed to proceed.     HPI: Amanda Dickson is a 74 y.o. female who presents for colonoscopy for ongoing colon cancer surveillance and colon cancer screening.  History of stage I, T1 N0 colon cancer diagnosed in 08/2020.    Last colonoscopy was 09/22/2021 and notable for healthy appearing ileocolonic anastomosis, left-sided diverticulosis, grade 2 internal hemorrhoids, normal TI with recommendation to repeat in 3 years.  - Colonoscopy ( 9/ 7/ 2021) : 3 cm ulcerating mass in the proximal ascending colon. Pathology: Invasive adenocarcinoma. 15 mm polyp adjacent to the mass, not resected endoscopically as this would be resected surgically with the mass. Moderate diverticulosis of the sigmoid, grade 1 internal hemorrhoids - CT abdomen/ pelvis ( 9/ 9/ 2021) : Liver unremarkable, normal spleen, pancreas. No evidence of metastasis. Nodular thickening of ascending colon consistent with history of colon carcinoma. - Robotic Right hemicolectomy ( 9/ 13/ 2021) : Path with invasive, moderately differentiated adenocarcinoma measuring 2. 6 cm.  Tumor invades submucosa. 12 lymph nodes negative for metastatic carcinoma. Margins negative for adenoma and malignancy.  Additionally, history of right breast cancer in 2013.  Was recently seen by Dr. Cloretta in the Oncology Clinic on 10/01/2024.  Past Medical History:  Diagnosis Date   Acquired hypothyroidism 10/23/2021   Allergy    Arthritis Long ago!!   Breast cancer [facility]) 2013    Right, s/p lumpectomy with brachytherapy   Cervical spondylosis 04/23/2022   Chronic kidney disease    As a child per pt touch of Brights dx resolved per pt   Colon cancer ([facility]) 2021   Constipation    Dyslipidemia 09/08/2024   Elevated glucose 05/24/2022   Essential hypertension 09/18/2023   History of breast cancer 10/23/2021   History of colon cancer 10/23/2021   History of colon resection 10/23/2021   Hot flashes 09/18/2023   Hypothyroidism    Insomnia 09/18/2023   Left hip pain 10/18/2021   Loose stools    current issue 2025   Lumbar spondylosis 11/05/2022   Post-menopausal 11/28/2022   Prediabetes 06/05/2023    Past Surgical History:  Procedure Laterality Date   APPENDECTOMY  2021   part of colon removal   BREAST SURGERY  2013   Rt. Lumpectomy   CHOLECYSTECTOMY     COLON SURGERY  09-12-2020   COLONOSCOPY  09/06/2020   Dr. Layla Gave - [facility]. Moderate diverticulosis of the sigmoid colon, Mass (3cm) in the proximal ascending colon.   COLONOSCOPY  10/16/2010   Dr. Layla Gave - [facility]. Moderate diverticulosis sigmoid colon, grade 1 internal hemorrhoids   HERNIA REPAIR     TONSILLECTOMY     TUBAL LIGATION      Prior to Admission medications   Medication Sig Start Date End Date Taking? Authorizing Provider  aspirin  EC 81 MG tablet Take 81 mg by mouth daily.   Yes [provider]  Biotin 10 MG/ML LIQD Take by mouth daily at 12 noon.   Yes [provider]  calcium carbonate (OS-CAL) 1250 (500 Ca) MG chewable tablet  Chew 1 tablet by mouth daily. Patient taking differently: Chew 2 tablets by mouth daily.   Yes [provider]  cholecalciferol (VITAMIN D3) 25 MCG (1000 UNIT) tablet Take 1,000 Units by mouth daily.   Yes [provider]  CINNAMON EXTRACT PO Take 3,000 mg by mouth daily at 12 noon. Patient taking differently: Take 4,000 mg by mouth daily at 12 noon.   Yes [provider]   Cyanocobalamin (VITAMIN B-12) 5000 MCG SUBL Take 5,000 mcg by mouth daily at 6 (six) AM. 01/24/18  Yes [provider]  diphenhydrAMINE (BENADRYL) 25 mg capsule Take 25 mg by mouth at bedtime.   Yes [provider]  levothyroxine  (SYNTHROID ) 25 MCG tablet Take in addition to 50mcg tablet once per week. 09/18/23  Yes Frann Mabel Mt, DO  levothyroxine  (SYNTHROID ) 50 MCG tablet Take 1 tablet (50 mcg total) by mouth daily. 09/18/23  Yes Wendling, Mabel Mt, DO  Misc Natural Products (BEET ROOT PO) Take by mouth.   Yes [provider]  Multiple Vitamins-Minerals (MULTI-VITAMIN GUMMIES) CHEW Chew 2 tablets by mouth daily.   Yes [provider]  celecoxib  (CELEBREX ) 200 MG capsule TAKE ONE TO TWO CAPSULES BY MOUTH DAILY AS NEEDED FOR PAIN 06/29/24   Curtis Debby PARAS, MD  Fiber Adult Gummies 2 g CHEW Chew 2 each by mouth daily.    [provider]  nitroGLYCERIN  (NITROSTAT ) 0.4 MG SL tablet Place 1 tablet (0.4 mg total) under the tongue every 5 (five) minutes as needed for chest pain. Patient not taking: No sig reported 09/08/24   Bernie Lamar PARAS, MD    Current Outpatient Medications  Medication Sig Dispense Refill   aspirin  EC 81 MG tablet Take 81 mg by mouth daily.     Biotin 10 MG/ML LIQD Take by mouth daily at 12 noon.     calcium carbonate (OS-CAL) 1250 (500 Ca) MG chewable tablet Chew 1 tablet by mouth daily. (Patient taking differently: Chew 2 tablets by mouth daily.)     cholecalciferol (VITAMIN D3) 25 MCG (1000 UNIT) tablet Take 1,000 Units by mouth daily.     CINNAMON EXTRACT PO Take 3,000 mg by mouth daily at 12 noon. (Patient taking differently: Take 4,000 mg by mouth daily at 12 noon.)     Cyanocobalamin (VITAMIN B-12) 5000 MCG SUBL Take 5,000 mcg by mouth daily at 6 (six) AM.     diphenhydrAMINE (BENADRYL) 25 mg capsule Take 25 mg by mouth at bedtime.     levothyroxine  (SYNTHROID ) 25 MCG tablet Take in addition to 50mcg tablet  once per week. 30 tablet 1   levothyroxine  (SYNTHROID ) 50 MCG tablet Take 1 tablet (50 mcg total) by mouth daily. 90 tablet 3   Misc Natural Products (BEET ROOT PO) Take by mouth.     Multiple Vitamins-Minerals (MULTI-VITAMIN GUMMIES) CHEW Chew 2 tablets by mouth daily.     celecoxib  (CELEBREX ) 200 MG capsule TAKE ONE TO TWO CAPSULES BY MOUTH DAILY AS NEEDED FOR PAIN 180 capsule 0   Fiber Adult Gummies 2 g CHEW Chew 2 each by mouth daily.     nitroGLYCERIN  (NITROSTAT ) 0.4 MG SL tablet Place 1 tablet (0.4 mg total) under the tongue every 5 (five) minutes as needed for chest pain. (Patient not taking: No sig reported) 25 tablet 6   No current facility-administered medications for this visit.    Allergies as of 10/12/2024 - Review Complete 10/12/2024  Allergen Reaction Noted   Meperidine hcl  03/17/2012  Family History  Problem Relation Age of Onset   Melanoma Mother    Arthritis Mother    Cancer Mother    Vision loss Mother    Colon cancer Father    Prostate cancer Father    Liver cancer Father    Cancer Father    Colon cancer Maternal Grandfather    Cancer Maternal Grandfather    Crohn's disease Maternal Aunt        Ulcerative colitis   Cancer Brother    Rectal cancer Neg Hx    Esophageal cancer Neg Hx    Pancreatic cancer Neg Hx    Stomach cancer Neg Hx     Social History   Socioeconomic History   Marital status: Married    Spouse name: Cambelle Suchecki   Number of children: 2   Years of education: 16   Highest education level: Bachelor's degree (e.g., BA, AB, BS)  Occupational History   Occupation: Retired   Occupation: Retired  Tobacco Use   Smoking status: Never    Passive exposure: Never   Smokeless tobacco: Never  Vaping Use   Vaping status: Never Used  Substance and Sexual Activity   Alcohol use: Never   Drug use: Never   Sexual activity: Yes    Partners: Male    Birth control/protection: Post-menopausal  Other Topics Concern   Not on file  Social  History Narrative   Lives with her husband. She enjoys sewing, crocheting and travelling.   Social Drivers of Corporate investment banker Strain: Low Risk  (06/12/2024)   Overall Financial Resource Strain (CARDIA)    Difficulty of Paying Living Expenses: Not hard at all  Food Insecurity: No Food Insecurity (06/12/2024)   Hunger Vital Sign    Worried About Running Out of Food in the Last Year: Never true    Ran Out of Food in the Last Year: Never true  Transportation Needs: No Transportation Needs (06/12/2024)   PRAPARE - Administrator, Civil Service (Medical): No    Lack of Transportation (Non-Medical): No  Physical Activity: Inactive (06/12/2024)   Exercise Vital Sign    Days of Exercise per Week: 0 days    Minutes of Exercise per Session: Not on file  Stress: No Stress Concern Present (06/12/2024)   [employer] of Occupational Health - Occupational Stress Questionnaire    Feeling of Stress: Not at all  Social Connections: Socially Integrated (06/12/2024)   Social Connection and Isolation Panel    Frequency of Communication with Friends and Family: More than three times a week    Frequency of Social Gatherings with Friends and Family: More than three times a week    Attends Religious Services: More than 4 times per year    Active Member of [employer] or Organizations: Yes    Attends Engineer, structural: More than 4 times per year    Marital Status: Married  Catering manager Violence: Not At Risk (11/07/2023)   Humiliation, Afraid, Rape, and Kick questionnaire    Fear of Current or Ex-Partner: No    Emotionally Abused: No    Physically Abused: No    Sexually Abused: No    Physical Exam: Vital signs in last 24 hours: @BP  123/76   Pulse 78   Temp (!) 96.4 F (35.8 C)   Ht 5' 5 (1.651 m)   Wt 186 lb (84.4 kg)   SpO2 96%   BMI 30.95 kg/m  GEN: NAD EYE: Sclerae anicteric ENT: MMM  CV: Non-tachycardic Pulm: CTA b/l GI: Soft, NT/ND NEURO:  Alert &  Oriented x 3   Sandor Flatter, DO [facility]   10/12/2024 7:43 AM

## 2024-10-12 NOTE — Patient Instructions (Addendum)
 Handouts given: Diverticulosis, Hemorrhoids, Low FODMAP Diet Resume previous diet. Continue present medications.  Repeat colonoscopy in 5 years for surveillance. Return to GI office PRN.  YOU HAD AN ENDOSCOPIC PROCEDURE TODAY AT THE [facility]:   Refer to the procedure report that was given to you for any specific questions about what was found during the examination.  If the procedure report does not answer your questions, please call your gastroenterologist to clarify.  If you requested that your care partner not be given the details of your procedure findings, then the procedure report has been included in a sealed envelope for you to review at your convenience later.  YOU SHOULD EXPECT: Some feelings of bloating in the abdomen. Passage of more gas than usual.  Walking can help get rid of the air that was put into your GI tract during the procedure and reduce the bloating. If you had a lower endoscopy (such as a colonoscopy or flexible sigmoidoscopy) you may notice spotting of blood in your stool or on the toilet paper. If you underwent a bowel prep for your procedure, you may not have a normal bowel movement for a few days.  Please Note:  You might notice some irritation and congestion in your nose or some drainage.  This is from the oxygen used during your procedure.  There is no need for concern and it should clear up in a day or so.  SYMPTOMS TO REPORT IMMEDIATELY:  Following lower endoscopy (colonoscopy or flexible sigmoidoscopy):  Excessive amounts of blood in the stool  Significant tenderness or worsening of abdominal pains  Swelling of the abdomen that is new, acute  Fever of 100F or higher   For urgent or emergent issues, a gastroenterologist can be reached at any hour by calling [phone]. Do not use MyChart messaging for urgent concerns.    DIET:  We do recommend a small meal at first, but then you may proceed to your regular diet.  Drink plenty of fluids  but you should avoid alcoholic beverages for 24 hours.  ACTIVITY:  You should plan to take it easy for the rest of today and you should NOT DRIVE or use heavy machinery until tomorrow (because of the sedation medicines used during the test).    FOLLOW UP: Our staff will call the number listed on your records the next business day following your procedure.  We will call around 7:15- 8:00 am to check on you and address any questions or concerns that you may have regarding the information given to you following your procedure. If we do not reach you, we will leave a message.     If any biopsies were taken you will be contacted by phone or by letter within the next 1-3 weeks.  Please call us  at [phone] if you have not heard about the biopsies in 3 weeks.    SIGNATURES/CONFIDENTIALITY: You and/or your care partner have signed paperwork which will be entered into your electronic medical record.  These signatures attest to the fact that that the information above on your After Visit Summary has been reviewed and is understood.  Full responsibility of the confidentiality of this discharge information lies with you and/or your care-partner.

## 2024-10-12 NOTE — Progress Notes (Signed)
 Sedate, gd SR, tolerated procedure well, VSS, report to RN

## 2024-10-12 NOTE — Progress Notes (Signed)
 Per Tim, CRNA, ok to proceed with procedure.

## 2024-10-12 NOTE — Op Note (Signed)
 [facility] Patient Name: Amanda Dickson Procedure Date: 10/12/2024 7:45 AM MRN: [id number] Endoscopist: Sandor Flatter , MD, [id number] Age: 74 Referring MD:  Date of Birth: 1950/10/16 Gender: Female Account #: [id number] Procedure:                Colonoscopy Indications:              High risk colon cancer surveillance: Personal                            history of colon cancer                           History of stage I, T1 N0 colon cancer diagnosed in                            08/2020.                           Last colonoscopy was 09/22/2021 and notable for                            healthy appearing ileocolonic anastomosis,                            left-sided diverticulosis, grade 2 internal                            hemorrhoids, normal TI with recommendation to                            repeat in 3 years. Medicines:                Monitored Anesthesia Care Procedure:                Pre-Anesthesia Assessment:                           - Prior to the procedure, a History and Physical                            was performed, and patient medications and                            allergies were reviewed. The patient's tolerance of                            previous anesthesia was also reviewed. The risks                            and benefits of the procedure and the sedation                            options and risks were discussed with the patient.                            All questions were answered,  and informed consent                            was obtained. Prior Anticoagulants: The patient has                            taken no anticoagulant or antiplatelet agents. ASA                            Grade Assessment: III - A patient with severe                            systemic disease. After reviewing the risks and                            benefits, the patient was deemed in satisfactory                            condition to undergo the  procedure.                           After obtaining informed consent, the colonoscope                            was passed under direct vision. Throughout the                            procedure, the patient's blood pressure, pulse, and                            oxygen saturations were monitored continuously. The                            Olympus Scope SN: [id number] was introduced through                            the anus and advanced to the the terminal ileum.                            The colonoscopy was performed without difficulty.                            The patient tolerated the procedure well. The                            quality of the bowel preparation was good. The                            rectum, ileocolonic anastamosis, and neoterminal                            ileum were photographed. Scope In: 8:02:43 AM Scope Out: 8:16:43 AM Scope Withdrawal Time: 0 hours 9 minutes 26 seconds  Total Procedure Duration: 0 hours 14 minutes 0  seconds  Findings:                 Skin tags were found on perianal exam.                           There was evidence of a prior end-to-side                            ileo-colonic anastomosis in the transverse colon.                            This was patent and was characterized by healthy                            appearing mucosa. The anastomosis was traversed.                           Multiple medium-mouthed and small-mouthed                            diverticula were found in the sigmoid colon and                            descending colon.                           Non-bleeding internal hemorrhoids were found during                            retroflexion. The hemorrhoids were medium-sized and                            Grade II (internal hemorrhoids that prolapse but                            reduce spontaneously).                           The neo-terminal ileum appeared normal. Complications:            No immediate  complications. Estimated Blood Loss:     Estimated blood loss: none. Impression:               - Perianal skin tags found on perianal exam.                           - Patent end-to-side ileo-colonic anastomosis,                            characterized by healthy appearing mucosa.                           - Diverticulosis in the sigmoid colon and in the                            descending colon.                           -  Non-bleeding internal hemorrhoids.                           - The examined portion of the ileum was normal.                           - No specimens collected. Recommendation:           - Patient has a contact number available for                            emergencies. The signs and symptoms of potential                            delayed complications were discussed with the                            patient. Return to normal activities tomorrow.                            Written discharge instructions were provided to the                            patient.                           - Resume previous diet.                           - Continue present medications.                           - Repeat colonoscopy in 5 years for surveillance.                           - Return to GI office PRN. Sandor Flatter, MD 10/12/2024 8:29:28 AM

## 2024-10-12 NOTE — Progress Notes (Addendum)
 Patient did have episode of chest pain after previsit.  Saw cardiologist, had an Echocardiogram and CT scan done on 9/23 and 9/24. Will notify CRNA, Tim Blocker, so he can review results.

## 2024-10-13 ENCOUNTER — Telehealth: Payer: Self-pay

## 2024-10-13 ENCOUNTER — Ambulatory Visit: Admitting: Cardiology

## 2024-10-13 ENCOUNTER — Encounter: Payer: Self-pay | Admitting: Family Medicine

## 2024-10-13 NOTE — Telephone Encounter (Signed)
  Follow up Call-     10/12/2024    7:12 AM  Call back number  Post procedure Call Back phone  # [phone]  Permission to leave phone message Yes     Patient questions:  Do you have a fever, pain , or abdominal swelling? No. Pain Score  0 *  Have you tolerated food without any problems? Yes.    Have you been able to return to your normal activities? Yes.    Do you have any questions about your discharge instructions: Diet   No. Medications  No. Follow up visit  No.  Do you have questions or concerns about your Care? No.  Actions: * If pain score is 4 or above: No action needed, pain <4.

## 2024-10-14 ENCOUNTER — Other Ambulatory Visit: Payer: Self-pay

## 2024-10-14 DIAGNOSIS — M25552 Pain in left hip: Secondary | ICD-10-CM

## 2024-10-14 MED ORDER — CELECOXIB 200 MG PO CAPS: ORAL_CAPSULE | ORAL | 0 refills | Status: AC

## 2024-10-15 ENCOUNTER — Other Ambulatory Visit (HOSPITAL_BASED_OUTPATIENT_CLINIC_OR_DEPARTMENT_OTHER)

## 2024-10-16 ENCOUNTER — Telehealth: Payer: Self-pay

## 2024-10-16 NOTE — Telephone Encounter (Signed)
 Left message on My Chart with CT Angio results per Dr. Karry note. Routed to PCP.

## 2024-10-19 ENCOUNTER — Telehealth: Payer: Self-pay

## 2024-10-19 NOTE — Telephone Encounter (Signed)
 Pt viewed CT Angio results on My Chart per Dr. Karry note. Routed to PCP.

## 2024-10-23 ENCOUNTER — Ambulatory Visit: Attending: Cardiology | Admitting: Cardiology

## 2024-10-23 ENCOUNTER — Encounter: Payer: Self-pay | Admitting: Cardiology

## 2024-10-23 VITALS — BP 158/75 | HR 58 | Ht 65.0 in | Wt 189.0 lb

## 2024-10-23 DIAGNOSIS — I10 Essential (primary) hypertension: Secondary | ICD-10-CM | POA: Diagnosis present

## 2024-10-23 DIAGNOSIS — E039 Hypothyroidism, unspecified: Secondary | ICD-10-CM | POA: Diagnosis present

## 2024-10-23 DIAGNOSIS — E785 Hyperlipidemia, unspecified: Secondary | ICD-10-CM | POA: Insufficient documentation

## 2024-10-23 NOTE — Progress Notes (Signed)
 Cardiology Office Note:    Date:  10/23/2024   ID:  Amanda Dickson, DOB February 26, 1950, MRN [id number]  PCP:  Frann Mabel Mt, DO  Cardiologist:  Lamar Fitch, MD    Referring MD: Frann Mabel Mt*   Chief Complaint  Patient presents with   Follow-up  Doing fine  History of Present Illness:    Amanda Dickson is a 74 y.o. female past medical history significant for essential hypertension, hypothyroidism, dyslipidemia history of breast CVA in 2013 colon cancer 2021.  She was referred to us  because of episode of chest pain.  That happened after she was in the emergency room workup in the emergency room was negative however all workup included echocardiogram which was normal she also had coronary CT angio which showed calcium score of 0 and no coronary artery disease.  She comes today to talk about is doing well denies of any chest pain tightness squeezing pressure burning chest overall doing well  Past Medical History:  Diagnosis Date   Acquired hypothyroidism 10/23/2021   Allergy    Arthritis Long ago!!   Breast cancer [facility]) 2013   Right, s/p lumpectomy with brachytherapy   Cervical spondylosis 04/23/2022   Chronic kidney disease    As a child per pt touch of Brights dx resolved per pt   Colon cancer (HCC) 2021   Constipation    Dyslipidemia 09/08/2024   Elevated glucose 05/24/2022   Essential hypertension 09/18/2023   History of breast cancer 10/23/2021   History of colon cancer 10/23/2021   History of colon resection 10/23/2021   Hot flashes 09/18/2023   Hypothyroidism    Insomnia 09/18/2023   Left hip pain 10/18/2021   Loose stools    current issue 2025   Lumbar spondylosis 11/05/2022   Post-menopausal 11/28/2022   Prediabetes 06/05/2023    Past Surgical History:  Procedure Laterality Date   APPENDECTOMY  2021   part of colon removal   BREAST SURGERY  2013   Rt. Lumpectomy   CHOLECYSTECTOMY     COLON SURGERY  09-12-2020   COLONOSCOPY   09/06/2020   Dr. Layla Gave - [facility]. Moderate diverticulosis of the sigmoid colon, Mass (3cm) in the proximal ascending colon.   COLONOSCOPY  10/16/2010   Dr. Layla Gave - [facility]. Moderate diverticulosis sigmoid colon, grade 1 internal hemorrhoids   HERNIA REPAIR     TONSILLECTOMY     TUBAL LIGATION      Current Medications: Current Meds  Medication Sig   aspirin  EC 81 MG tablet Take 81 mg by mouth daily.   Biotin 10 MG/ML LIQD Take by mouth daily at 12 noon.   calcium carbonate (OS-CAL) 1250 (500 Ca) MG chewable tablet Chew 1 tablet by mouth daily. (Patient taking differently: Chew 2 tablets by mouth daily.)   celecoxib  (CELEBREX ) 200 MG capsule TAKE ONE TO TWO CAPSULES BY MOUTH DAILY AS NEEDED FOR PAIN   cholecalciferol (VITAMIN D3) 25 MCG (1000 UNIT) tablet Take 1,000 Units by mouth daily.   CINNAMON EXTRACT PO Take 3,000 mg by mouth daily at 12 noon. (Patient taking differently: Take 4,000 mg by mouth daily at 12 noon.)   Cyanocobalamin (VITAMIN B-12) 5000 MCG SUBL Take 5,000 mcg by mouth daily at 6 (six) AM.   diphenhydrAMINE (BENADRYL) 25 mg capsule Take 25 mg by mouth at bedtime.   Fiber Adult Gummies 2 g CHEW Chew 2 each by mouth daily.   levothyroxine  (SYNTHROID ) 25 MCG tablet Take in addition to 50mcg  tablet once per week.   levothyroxine  (SYNTHROID ) 50 MCG tablet Take 1 tablet (50 mcg total) by mouth daily.   Misc Natural Products (BEET ROOT PO) Take by mouth.   Multiple Vitamins-Minerals (MULTI-VITAMIN GUMMIES) CHEW Chew 2 tablets by mouth daily.   nitroGLYCERIN  (NITROSTAT ) 0.4 MG SL tablet Place 1 tablet (0.4 mg total) under the tongue every 5 (five) minutes as needed for chest pain.     Allergies:   Meperidine hcl   Social History   Socioeconomic History   Marital status: Married    Spouse name: Cyanne Delmar   Number of children: 2   Years of education: 16   Highest education level: Bachelor's degree (e.g., BA, AB, BS)   Occupational History   Occupation: Retired   Occupation: Retired  Tobacco Use   Smoking status: Never    Passive exposure: Never   Smokeless tobacco: Never  Vaping Use   Vaping status: Never Used  Substance and Sexual Activity   Alcohol use: Never   Drug use: Never   Sexual activity: Yes    Partners: Male    Birth control/protection: Post-menopausal  Other Topics Concern   Not on file  Social History Narrative   Lives with her husband. She enjoys sewing, crocheting and travelling.   Social Drivers of Corporate investment banker Strain: Low Risk  (06/12/2024)   Overall Financial Resource Strain (CARDIA)    Difficulty of Paying Living Expenses: Not hard at all  Food Insecurity: No Food Insecurity (06/12/2024)   Hunger Vital Sign    Worried About Running Out of Food in the Last Year: Never true    Ran Out of Food in the Last Year: Never true  Transportation Needs: No Transportation Needs (06/12/2024)   PRAPARE - Administrator, Civil Service (Medical): No    Lack of Transportation (Non-Medical): No  Physical Activity: Inactive (06/12/2024)   Exercise Vital Sign    Days of Exercise per Week: 0 days    Minutes of Exercise per Session: Not on file  Stress: No Stress Concern Present (06/12/2024)   [employer] of Occupational Health - Occupational Stress Questionnaire    Feeling of Stress: Not at all  Social Connections: Socially Integrated (06/12/2024)   Social Connection and Isolation Panel    Frequency of Communication with Friends and Family: More than three times a week    Frequency of Social Gatherings with Friends and Family: More than three times a week    Attends Religious Services: More than 4 times per year    Active Member of [employer] or Organizations: Yes    Attends Engineer, structural: More than 4 times per year    Marital Status: Married     Family History: The patient's family history includes Arthritis in her mother; Cancer in her  brother, father, maternal grandfather, and mother; Colon cancer in her father and maternal grandfather; Crohn's disease in her maternal aunt; Liver cancer in her father; Melanoma in her mother; Prostate cancer in her father; Vision loss in her mother. There is no history of Rectal cancer, Esophageal cancer, Pancreatic cancer, or Stomach cancer. ROS:   Please see the history of present illness.    All 14 point review of systems negative except as described per history of present illness  EKGs/Labs/Other Studies Reviewed:         Recent Labs: 06/15/2024: TSH 1.73 07/21/2024: ALT 23; Magnesium 1.8 09/05/2024: BUN 18; Creatinine, Ser 1.02; Hemoglobin 14.1; Platelets 215; Potassium 3.3;  Sodium 142  Recent Lipid Panel    Component Value Date/Time   CHOL 202 (H) 06/15/2024 0724   TRIG 151 (H) 06/15/2024 0724   HDL 66 06/15/2024 0724   CHOLHDL 3.1 06/15/2024 0724   VLDL 26 11/29/2022 0850   LDLCALC 110 (H) 06/15/2024 0724    Physical Exam:    VS:  BP (!) 158/75   Pulse (!) 58   Ht 5' 5 (1.651 m)   Wt 189 lb (85.7 kg)   SpO2 99%   BMI 31.45 kg/m     Wt Readings from Last 3 Encounters:  10/23/24 189 lb (85.7 kg)  10/12/24 186 lb (84.4 kg)  10/01/24 188 lb 9.6 oz (85.5 kg)     GEN:  Well nourished, well developed in no acute distress HEENT: Normal NECK: No JVD; No carotid bruits LYMPHATICS: No lymphadenopathy CARDIAC: RRR, no murmurs, no rubs, no gallops RESPIRATORY:  Clear to auscultation without rales, wheezing or rhonchi  ABDOMEN: Soft, non-tender, non-distended MUSCULOSKELETAL:  No edema; No deformity  SKIN: Warm and dry LOWER EXTREMITIES: no swelling NEUROLOGIC:  Alert and oriented x 3 PSYCHIATRIC:  Normal affect   ASSESSMENT:    1. Essential hypertension   2. Acquired hypothyroidism   3. Dyslipidemia    PLAN:    In order of problems listed above:  Essential hypertension will be elevated today but she is excited to talk to her about this issue today.  Ask her to  check blood pressure on the regular basis. Atypical chest pain.  No evidence of coronary disease based on coronary CT angio I did calculate her 10 years predicted risk she is at 1.9%, and her coronary age is 74 years old I told her all those good news is and she is very happy and thrilled about it.  We did talk about need to exercise on the regular basis and good diet. Dyslipidemia no need to initiate statin therapy continue monitoring.   Medication Adjustments/Labs and Tests Ordered: Current medicines are reviewed at length with the patient today.  Concerns regarding medicines are outlined above.  No orders of the defined types were placed in this encounter.  Medication changes: No orders of the defined types were placed in this encounter.   Signed, Lamar DOROTHA Fitch, MD, [facility] 10/23/2024 11:10 AM    [facility]

## 2024-10-23 NOTE — Patient Instructions (Signed)
 Medication Instructions:  Your physician recommends that you continue on your current medications as directed. Please refer to the Current Medication list given to you today.  *If you need a refill on your cardiac medications before your next appointment, please call your pharmacy*  Lab Work: NONE If you have labs (blood work) drawn today and your tests are completely normal, you will receive your results only by: MyChart Message (if you have MyChart) OR A paper copy in the mail If you have any lab test that is abnormal or we need to change your treatment, we will call you to review the results.  Testing/Procedures: NONE  Follow-Up: At [facility], you and your health needs are our priority.  As part of our continuing mission to provide you with exceptional heart care, our providers are all part of one team.  This team includes your primary Cardiologist (physician) and Advanced Practice Providers or APPs (Physician Assistants and Nurse Practitioners) who all work together to provide you with the care you need, when you need it.  Your next appointment:  Call or return to clinic prn if these symptoms worsen or fail to improve as anticipated.     Provider:   Lamar Fitch, MD    We recommend signing up for the patient portal called MyChart.  Sign up information is provided on this After Visit Summary.  MyChart is used to connect with patients for Virtual Visits (Telemedicine).  Patients are able to view lab/test results, encounter notes, upcoming appointments, etc.  Non-urgent messages can be sent to your provider as well.   To learn more about what you can do with MyChart, go to [email or web address].   Other Instructions

## 2024-11-10 ENCOUNTER — Ambulatory Visit: Admitting: Cardiology

## 2024-11-12 ENCOUNTER — Ambulatory Visit: Admitting: *Deleted

## 2024-11-12 ENCOUNTER — Encounter: Payer: Self-pay | Admitting: Family Medicine

## 2024-11-12 ENCOUNTER — Telehealth: Payer: Self-pay | Admitting: *Deleted

## 2024-11-12 VITALS — BP 160/78 | HR 64 | Temp 97.8°F | Resp 18 | Ht 65.5 in | Wt 190.6 lb

## 2024-11-12 DIAGNOSIS — Z780 Asymptomatic menopausal state: Secondary | ICD-10-CM

## 2024-11-12 DIAGNOSIS — M8580 Other specified disorders of bone density and structure, unspecified site: Secondary | ICD-10-CM

## 2024-11-12 DIAGNOSIS — Z0000 Encounter for general adult medical examination without abnormal findings: Secondary | ICD-10-CM | POA: Diagnosis not present

## 2024-11-12 NOTE — Telephone Encounter (Signed)
 Left reminder on pt's voicemail regarding below.

## 2024-11-12 NOTE — Telephone Encounter (Signed)
 Please review in PCP absence Pt had AWV in office today.  BP readings were 155/69 (automated) and 160/78 (manual). She denies headaches, vision changes, SOB. She reports that she just completed some testing with cardiology and was told her heart is great. Has not been checking readings at home. Will check over the next week and message us  with her readings.  Also has OV with PCP on 12/15/24.  Please advise.

## 2024-11-12 NOTE — Progress Notes (Signed)
 Please attest this visit in the absence of patient primary care provider.    Chief Complaint  Patient presents with   Medicare Wellness     Subjective:   Amanda Dickson is a 74 y.o. female who presents for a Medicare Annual Wellness Visit.  Allergies (verified) Meperidine hcl   History: Past Medical History:  Diagnosis Date   Acquired hypothyroidism 10/23/2021   Allergy    Arthritis Long ago!!   Breast cancer (HCC) 2013   Right, s/p lumpectomy with brachytherapy   Cervical spondylosis 04/23/2022   Chronic kidney disease    As a child per pt touch of Brights dx resolved per pt   Colon cancer (HCC) 2021   Constipation    Dyslipidemia 09/08/2024   Elevated glucose 05/24/2022   Essential hypertension 09/18/2023   History of breast cancer 10/23/2021   History of colon cancer 10/23/2021   History of colon resection 10/23/2021   Hot flashes 09/18/2023   Hypothyroidism    Insomnia 09/18/2023   Left hip pain 10/18/2021   Loose stools    current issue 2025   Lumbar spondylosis 11/05/2022   Post-menopausal 11/28/2022   Prediabetes 06/05/2023   Past Surgical History:  Procedure Laterality Date   APPENDECTOMY  2021   part of colon removal   BREAST SURGERY  2013   Rt. Lumpectomy   CHOLECYSTECTOMY     COLON SURGERY  09-12-2020   COLONOSCOPY  09/06/2020   Dr. Layla Gave - [place]  [facility]. Moderate diverticulosis of the sigmoid colon, Mass (3cm) in the proximal ascending colon.   COLONOSCOPY  10/16/2010   Dr. Layla Gave - [place]  [facility]. Moderate diverticulosis sigmoid colon, grade 1 internal hemorrhoids   HERNIA REPAIR     TONSILLECTOMY     TUBAL LIGATION     Family History  Problem Relation Age of Onset   Melanoma Mother    Arthritis Mother    Cancer Mother    Vision loss Mother    Colon cancer Father    Prostate cancer Father    Liver cancer Father    Cancer Father    Cancer Brother    Colon cancer Maternal Grandfather    Cancer  Maternal Grandfather    Crohn's disease Maternal Aunt        Ulcerative colitis   Diabetes Mellitus II Daughter    Rectal cancer Neg Hx    Esophageal cancer Neg Hx    Pancreatic cancer Neg Hx    Stomach cancer Neg Hx    Social History   Occupational History   Occupation: Retired   Occupation: Retired  Tobacco Use   Smoking status: Never    Passive exposure: Never   Smokeless tobacco: Never  Vaping Use   Vaping status: Never Used  Substance and Sexual Activity   Alcohol use: Never   Drug use: Never   Sexual activity: Yes    Partners: Male    Birth control/protection: Post-menopausal   Tobacco Counseling Counseling given: Not Answered  SDOH Screenings   Food Insecurity: No Food Insecurity (11/05/2024)  Housing: Low Risk  (11/05/2024)  Transportation Needs: No Transportation Needs (11/05/2024)  Utilities: Not At Risk (11/12/2024)  Alcohol Screen: Low Risk  (08/31/2022)  Depression (PHQ2-9): Low Risk  (10/01/2024)  Financial Resource Strain: Low Risk  (11/05/2024)  Physical Activity: Inactive (11/05/2024)  Social Connections: Socially Integrated (11/05/2024)  Stress: No Stress Concern Present (11/05/2024)  Tobacco Use: Low Risk  (11/12/2024)  Health Literacy: Adequate Health Literacy (11/07/2023)  See flowsheets for full screening details  Depression Screen PHQ 2 & 9 Depression Scale- Over the past 2 weeks, how often have you been bothered by any of the following problems? Little interest or pleasure in doing things: 0 Feeling down, depressed, or hopeless (PHQ Adolescent also includes...irritable): 0 PHQ-2 Total Score: 0 Trouble falling or staying asleep, or sleeping too much: 0 Feeling tired or having little energy: 0 Poor appetite or overeating (PHQ Adolescent also includes...weight loss): 0 Feeling bad about yourself - or that you are a failure or have let yourself or your family down: 0 Trouble concentrating on things, such as reading the newspaper or watching television  (PHQ Adolescent also includes...like school work): 0 Moving or speaking so slowly that other people could have noticed. Or the opposite - being so fidgety or restless that you have been moving around a lot more than usual: 0 Thoughts that you would be better off dead, or of hurting yourself in some way: 0 PHQ-9 Total Score: 0 If you checked off any problems, how difficult have these problems made it for you to do your work, take care of things at home, or get along with other people?: Not difficult at all     Goals Addressed             This Visit's Progress    She would like to lose weight, goal being 165lb and get her hemoblobin a1c down, currently 6.4%         Visit info / Clinical Intake: Medicare Wellness Visit Type:: Subsequent Annual Wellness Visit Persons participating in visit:: patient Medicare Wellness Visit Mode:: In-person (required for WTM) Information given by:: patient Interpreter Needed?: No Pre-visit prep was completed: yes AWV questionnaire completed by patient prior to visit?: yes Date:: 11/05/24 Living arrangements:: lives with spouse/significant other Patient's Overall Health Status Rating: very good Typical amount of pain: some (has a lot of pain if she doesn't keep moving. pain worsened by sitting or walking too much) Does pain affect daily life?: no Are you currently prescribed opioids?: no  Dietary Habits and Nutritional Risks How many meals a day?: 2 (2-3 meals a day) Eats fruit and vegetables daily?: (!) no (doesn't like vegetables and eats fruit sparingly. Eats peas, corn and potatoes) Most meals are obtained by: preparing own meals In the last 2 weeks, have you had any of the following?: (!) nausea, vomiting, diarrhea (Has intermittent diarrhea, cannot determine foods that trigger it. Usually occurs once a week. Has discussed with GI.) Diabetic:: no  Functional Status Activities of Daily Living (to include ambulation/medication): (Patient-Rptd)  Independent Ambulation: (Patient-Rptd) Independent Medication Administration: Independent Home Management: (Patient-Rptd) Independent Manage your own finances?: yes Primary transportation is: driving Concerns about vision?: no *vision screening is required for WTM* (up to date with [facility]) Concerns about hearing?: no  Fall Screening Falls in the past year?: 1 (was cleaning behind the bed and slid off the end.) Number of falls in past year: (Patient-Rptd) 1 Was there an injury with Fall?: (Patient-Rptd) 0 Fall Risk Category Calculator: (Patient-Rptd) 2 Patient Fall Risk Level: (Patient-Rptd) Moderate Fall Risk  Fall Risk Patient at Risk for Falls Due to: History of fall(s); Orthopedic patient Fall risk Follow up: Falls evaluation completed  Home and Transportation Safety: All rugs have non-skid backing?: (!) no All stairs or steps have railings?: yes Grab bars in the bathtub or shower?: yes Have non-skid surface in bathtub or shower?: yes Good home lighting?: yes Regular seat belt use?: yes  Hospital stays in the last year:: no  Cognitive Assessment Difficulty concentrating, remembering, or making decisions? : yes (Notes forgetfulness since her 48s. Is participating in an Alzheimer's study and completed online assessments periodically that have stayed stable with minimal dips. Reports that she did not meet requirements to advance in the study.) Will 6CIT or Mini Cog be Completed: yes What year is it?: 0 points What month is it?: 0 points Give patient an address phrase to remember (5 components): [address] [place]  About what time is it?: 0 points Count backwards from 20 to 1: 0 points Say the months of the year in reverse: 0 points Repeat the address phrase from earlier: 0 points 6 CIT Score: 0 points  Advance Directives (For Healthcare) Does Patient Have a Medical Advance Directive?: Yes Does patient want to make changes to medical advance directive?: No -  Patient declined Type of Advance Directive: Healthcare Power of [place]; Living will Copy of Healthcare Power of Attorney in Chart?: No - copy requested Copy of Living Will in Chart?: No - copy requested  Reviewed/Updated  Reviewed/Updated: Reviewed All (Medical, Surgical, Family, Medications, Allergies, Care Teams, Patient Goals)        Objective:    Today's Vitals   11/12/24 0812  Pulse: 64  Resp: 18  Temp: 97.8 F (36.6 C)  TempSrc: Oral  SpO2: 98%  Weight: 190 lb 9.6 oz (86.5 kg)  Height: 5' 5.5 (1.664 m)   Body mass index is 31.24 kg/m.  Current Medications (verified) Outpatient Encounter Medications as of 11/12/2024  Medication Sig   Biotin 10 MG/ML LIQD Take by mouth daily at 12 noon.   calcium carbonate (OS-CAL) 1250 (500 Ca) MG chewable tablet Chew 1 tablet by mouth daily. (Patient taking differently: Chew 2 tablets by mouth daily.)   celecoxib  (CELEBREX ) 200 MG capsule TAKE ONE TO TWO CAPSULES BY MOUTH DAILY AS NEEDED FOR PAIN   cholecalciferol (VITAMIN D3) 25 MCG (1000 UNIT) tablet Take 1,000 Units by mouth daily.   CINNAMON EXTRACT PO Take 3,000 mg by mouth daily at 12 noon. (Patient taking differently: Take 4,000 mg by mouth daily at 12 noon.)   Cyanocobalamin (VITAMIN B-12) 5000 MCG SUBL Take 5,000 mcg by mouth daily at 6 (six) AM.   diphenhydrAMINE (BENADRYL) 25 mg capsule Take 25 mg by mouth at bedtime.   levothyroxine  (SYNTHROID ) 25 MCG tablet Take in addition to 50mcg tablet once per week.   levothyroxine  (SYNTHROID ) 50 MCG tablet Take 1 tablet (50 mcg total) by mouth daily.   Misc Natural Products (BEET ROOT PO) Take by mouth.   Multiple Vitamins-Minerals (MULTI-VITAMIN GUMMIES) CHEW Chew 2 tablets by mouth daily.   [DISCONTINUED] aspirin  EC 81 MG tablet Take 81 mg by mouth daily.   [DISCONTINUED] Fiber Adult Gummies 2 g CHEW Chew 2 each by mouth daily.   [DISCONTINUED] nitroGLYCERIN  (NITROSTAT ) 0.4 MG SL tablet Place 1 tablet (0.4 mg total) under the  tongue every 5 (five) minutes as needed for chest pain.   No facility-administered encounter medications on file as of 11/12/2024.   Hearing/Vision screen No results found. Immunizations and Health Maintenance Health Maintenance  Topic Date Due   COVID-19 Vaccine (4 - 2025-26 season) 08/31/2024   Mammogram  07/02/2025   Medicare Annual Wellness (AWV)  11/12/2025   Colonoscopy  10/12/2029   DTaP/Tdap/Td (2 - Td or Tdap) 08/01/2032   Pneumococcal Vaccine: 50+ Years  Completed   Influenza Vaccine  Completed   DEXA SCAN  Completed  Hepatitis C Screening  Completed   Zoster Vaccines- Shingrix  Completed   Meningococcal B Vaccine  Aged Out        Assessment/Plan:  This is a routine wellness examination for [facility].  Patient Care Team: Frann Mabel Mt, DO as PCP - General (Family Medicine)  I have personally reviewed and noted the following in the patient's chart:   Medical and social history Use of alcohol, tobacco or illicit drugs  Current medications and supplements including opioid prescriptions. Functional ability and status Nutritional status Physical activity Advanced directives List of other physicians Hospitalizations, surgeries, and ER visits in previous 12 months Vitals Screenings to include cognitive, depression, and falls Referrals and appointments  No orders of the defined types were placed in this encounter.  In addition, I have reviewed and discussed with patient certain preventive protocols, quality metrics, and best practice recommendations. A written personalized care plan for preventive services as well as general preventive health recommendations were provided to patient.   Lolita Libra, CMA   11/12/2024   Return in 1 year (on 11/12/2025).  After Visit Summary: (In Person-Printed) AVS printed and given to the patient  Nurse Notes: see phone note

## 2024-11-12 NOTE — Telephone Encounter (Signed)
 Pt confirmed receipt of below message and is agreeable. Also advised pt that I am unable to access the web address from [place]  for her advanced directive and [place] requires a notarized copy. She will check records at home for a notarized copy to forward to us .

## 2024-11-12 NOTE — Patient Instructions (Addendum)
 Amanda Dickson,  Thank you for taking the time for your Medicare Wellness Visit. I appreciate your continued commitment to your health goals. Please review the care plan we discussed, and feel free to reach out if I can assist you further.  Please note that Annual Wellness Visits do not include a physical exam. Some assessments may be limited, especially if the visit was conducted virtually. If needed, we may recommend an in-person follow-up with your provider.  GOAL:  She would like to lose weight, goal being 165lb and get her hemoblobin a1c down, currently 6.4%   Ongoing Care Seeing your primary care provider every 3 to 6 months helps us  monitor your health and provide consistent, personalized care.   Dr Frann:  12/15/24 7:30am  Annual Wellness Visit: 11/18/25 8:20am  Please check blood pressure at home over the next week and send your readings to Dr Frann.   Recommended Screenings:  You will need to get the following vaccines at your local pharmacy: [facility] Maintenance  Topic Date Due   COVID-19 Vaccine (4 - 2025-26 season) 08/31/2024   Medicare Annual Wellness Visit  11/06/2024   Breast Cancer Screening  07/02/2025   Colon Cancer Screening  10/12/2029   DTaP/Tdap/Td vaccine (2 - Td or Tdap) 08/01/2032   Pneumococcal Vaccine for age over 79  Completed   Flu Shot  Completed   DEXA scan (bone density measurement)  Completed   Hepatitis C Screening  Completed   Zoster (Shingles) Vaccine  Completed   Meningitis B Vaccine  Aged Out       10/01/2024    9:14 AM  Advanced Directives  Does Patient Have a Medical Advance Directive? Yes  Type of Advance Directive Living will;Healthcare Power of Attorney  Copy of Healthcare Power of Attorney in Chart? No - copy requested    Vision: Annual vision screenings are recommended for early detection of glaucoma, cataracts, and diabetic retinopathy. These exams can also reveal signs of chronic conditions such as diabetes and high  blood pressure.  Dental: Annual dental screenings help detect early signs of oral cancer, gum disease, and other conditions linked to overall health, including heart disease and diabetes.  Please see the attached documents for additional preventive care recommendations.

## 2024-11-21 ENCOUNTER — Other Ambulatory Visit: Payer: Self-pay | Admitting: Family Medicine

## 2024-12-15 ENCOUNTER — Ambulatory Visit: Admitting: Family Medicine

## 2024-12-15 ENCOUNTER — Encounter: Payer: Self-pay | Admitting: Family Medicine

## 2024-12-15 VITALS — BP 138/84 | HR 78 | Temp 98.0°F | Resp 16 | Ht 65.0 in | Wt 190.6 lb

## 2024-12-15 DIAGNOSIS — M1990 Unspecified osteoarthritis, unspecified site: Secondary | ICD-10-CM

## 2024-12-15 DIAGNOSIS — R202 Paresthesia of skin: Secondary | ICD-10-CM

## 2024-12-15 DIAGNOSIS — I10 Essential (primary) hypertension: Secondary | ICD-10-CM

## 2024-12-15 DIAGNOSIS — E039 Hypothyroidism, unspecified: Secondary | ICD-10-CM

## 2024-12-15 DIAGNOSIS — R7303 Prediabetes: Secondary | ICD-10-CM

## 2024-12-15 DIAGNOSIS — R400 Somnolence: Secondary | ICD-10-CM

## 2024-12-15 NOTE — Progress Notes (Signed)
 Chief Complaint  Patient presents with   Follow-up    Follow Up    Subjective: Patient is a 74 y.o. female here for f/u.  Hypothyroidism Patient presents for follow-up of hypothyroidism.  Reports compliance with medication. Current symptoms include: denies fatigue, weight changes, heat/cold intolerance, bowel/skin changes or CVS symptoms She believes her dose should be not significantly changed  Arthritis Hx of OA, taking Celebrex  200 mg bid prn. Seems to help. Started by sports med. Having some pain. Compliant w HEP which is helpful.   She has a longstanding history of tingling in both of her feet which comes and goes.  Usually does not come at the same time.  Her sports medicine specialist told her that it is related to her low back.  She does take vitamin B12 daily.  She is compliant with her medicines as noted above.  Patient has been having daytime sleepiness.  She sleeps around 7-7 1/2 hours nightly and wakes up feeling refreshed.  If she is doing something quiet, she will fall asleep for several minutes and wake up feeling refreshed.  She is not falling asleep while driving or do anything in particular.  She will fall asleep if she is quilting or at church.  She has never had a sleep study.  Past Medical History:  Diagnosis Date   Acquired hypothyroidism 10/23/2021   Allergy    Arthritis Long ago!!   Breast cancer (HCC) 2013   Right, s/p lumpectomy with brachytherapy   Cervical spondylosis 04/23/2022   Chronic kidney disease    As a child per pt touch of Brights dx resolved per pt   Colon cancer (HCC) 2021   Constipation    Dyslipidemia 09/08/2024   Elevated glucose 05/24/2022   Essential hypertension 09/18/2023   History of breast cancer 10/23/2021   History of colon cancer 10/23/2021   History of colon resection 10/23/2021   Hot flashes 09/18/2023   Hypothyroidism    Insomnia 09/18/2023   Left hip pain 10/18/2021   Loose stools    current issue 2025   Lumbar  spondylosis 11/05/2022   Post-menopausal 11/28/2022   Prediabetes 06/05/2023    Objective: BP 138/84 (BP Location: Left Arm, Cuff Size: Normal)   Pulse 78   Temp 98 F (36.7 C) (Oral)   Resp 16   Ht 5' 5 (1.651 m)   Wt 190 lb 9.6 oz (86.5 kg)   SpO2 98%   BMI 31.72 kg/m  General: Awake, appears stated age Neck: supple, symmetric, no thyromegaly or nodules noted Heart: RRR, no LE edema Lungs: CTAB, no rales, wheezes or rhonchi. No accessory muscle use Psych: Age appropriate judgment and insight, normal affect and mood  Assessment and Plan: Acquired hypothyroidism - Plan: T4, free, TSH  Arthritis  Essential hypertension - Plan: CBC, Comprehensive metabolic panel with GFR, Lipid panel  Prediabetes - Plan: Hemoglobin A1c  Paresthesia - Plan: B12  Daytime sleepiness  Chronic, stable.  Continue levothyroxine  50 mcg daily, 25 mcg once weekly.  Check labs next month. Chronic, stable.  Continue Celebrex  200 mg twice daily as needed.  Continue home exercise program. BP stable today. Reviewed imaging, very reassuring. Even if it does creep up, she may not benefit from more aggressive treatment.  Follow A1c. Add B12. May need injections if still low. Continue care with sports med team.  Will ck labs. She has never had a sleep study. Would consider referral to sleep team if labs are normal and she wishes to proceed.  F/u in 6 mo.  The patient voiced understanding and agreement to the plan.  I spent 41 min w the pt discussing the above plans in addition to reviewing her chart on the same day of the visit.   Mabel Mt Lamar, DO 12/15/2024  8:23 AM

## 2024-12-15 NOTE — Patient Instructions (Addendum)
 Give us  2-3 business days to get the results of your labs back after you come in.   Keep the diet clean and stay active.  Let us  know if you need anything.  Pectoralis Major Rehab Ask your health care provider which exercises are safe for you. Do exercises exactly as told by your health care provider and adjust them as directed. It is normal to feel mild stretching, pulling, tightness, or discomfort as you do these exercises, but you should stop right away if you feel sudden pain or your pain gets worse. Do not begin these exercises until told by your health care provider. Stretching and range of motion exercises These exercises warm up your muscles and joints and improve the movement and flexibility of your shoulder. These exercises can also help to relieve pain, numbness, and tingling. Exercise A: Pendulum  Stand near a wall or a surface that you can hold onto for balance. Bend at the waist and let your left / right arm hang straight down. Use your other arm to keep your balance. Relax your arm and shoulder muscles, and move your hips and your trunk so your left / right arm swings freely. Your arm should swing because of the motion of your body, not because you are using your arm or shoulder muscles. Keep moving so your arm swings in the following directions, as told by your health care provider: Side to side. Forward and backward. In clockwise and counterclockwise circles. Slowly return to the starting position. Repeat 2 times. Complete this exercise 3 times per week. Exercise B: Abduction, standing Stand and hold a broomstick, a cane, or a similar object. Place your hands a little more than shoulder-width apart on the object. Your left / right hand should be palm-up, and your other hand should be palm-down. While keeping your elbow straight and your shoulder muscles relaxed, push the stick across your body toward your left / right side. Raise your left / right arm to the side of your body  and then over your head until you feel a stretch in your shoulder. Stop when you reach the angle that is recommended by your health care provider. Avoid shrugging your shoulder while you raise your arm. Keep your shoulder blade tucked down toward the middle of your spine. Hold for 10 seconds. Slowly return to the starting position. Repeat 2 times. Complete this exercise 3 times per week. Exercise C: Wand flexion, supine  Lie on your back. You may bend your knees for comfort. Hold a broomstick, a cane, or a similar object so that your hands are about shoulder-width apart on the object. Your palms should face toward your feet. Raise your left / right arm in front of your face, then behind your head (toward the floor). Use your other hand to help you do this. Stop when you feel a gentle stretch in your shoulder, or when you reach the angle that is recommended by your health care provider. Hold for 3 seconds. Use the broomstick and your other arm to help you return your left / right arm to the starting position. Repeat 2 times. Complete this exercise 3 times per week. Exercise D: Wand shoulder external rotation Stand and hold a broomstick, a cane, or a similar object so your hands are about shoulder-width apart on the object. Start with your arms hanging down, then bend both elbows to an L shape (90 degrees). Keep your left / right elbow at your side. Use your other hand to push  the stick so your left / right forearm moves away from your body, out to your side. Keep your left / right elbow bent to 90 degrees and keep it against your side. Stop when you feel a gentle stretch in your shoulder, or when you reach the angle recommended by your health care provider. Hold for 10 seconds. Use the stick to help you return your left / right arm to the starting position. Repeat 2 times. Complete this exercise 3 times per week. Strengthening exercises These exercises build strength and endurance in your  shoulder. Endurance is the ability to use your muscles for a long time, even after your muscles get tired. Exercise E: Scapular protraction, standing Stand so you are facing a wall. Place your feet about one arm-length away from the wall. Place your hands on the wall and straighten your elbows. Keep your hands on the wall as you push your upper back away from the wall. You should feel your shoulder blades sliding forward. Keep your elbows and your head still. If you are not sure that you are doing this exercise correctly, ask your health care provider for more instructions. Hold for 3 seconds. Slowly return to the starting position. Let your muscles relax completely before you repeat this exercise. Repeat 2 times. Complete this exercise 3 times per week. Exercise F: Shoulder blade squeezes  (scapular retraction) Sit with good posture in a stable chair. Do not let your back touch the back of the chair. Your arms should be at your sides with your elbows bent. You may rest your forearms on a pillow if that is more comfortable. Squeeze your shoulder blades together. Bring them down and back. Keep your shoulders level. Do not lift your shoulders up toward your ears. Hold for 3 seconds. Return to the starting position. Repeat 2 times. Complete this exercise 3 times per week. This information is not intended to replace advice given to you by your health care provider. Make sure you discuss any questions you have with your health care provider. Document Released: 12/17/2005 Document Revised: 09/27/2016 Document Reviewed: 09/04/2015 Elsevier Interactive Patient Education  [employer].

## 2025-01-15 ENCOUNTER — Other Ambulatory Visit: Payer: Self-pay

## 2025-01-15 ENCOUNTER — Ambulatory Visit: Payer: Self-pay | Admitting: Family Medicine

## 2025-01-15 ENCOUNTER — Other Ambulatory Visit (INDEPENDENT_AMBULATORY_CARE_PROVIDER_SITE_OTHER)

## 2025-01-15 DIAGNOSIS — I10 Essential (primary) hypertension: Secondary | ICD-10-CM | POA: Diagnosis not present

## 2025-01-15 DIAGNOSIS — R202 Paresthesia of skin: Secondary | ICD-10-CM

## 2025-01-15 DIAGNOSIS — E039 Hypothyroidism, unspecified: Secondary | ICD-10-CM | POA: Diagnosis not present

## 2025-01-15 DIAGNOSIS — R7303 Prediabetes: Secondary | ICD-10-CM | POA: Diagnosis not present

## 2025-01-15 LAB — COMPREHENSIVE METABOLIC PANEL WITH GFR
ALT: 20 U/L (ref 3–35)
AST: 24 U/L (ref 5–37)
Albumin: 4.1 g/dL (ref 3.5–5.2)
Alkaline Phosphatase: 75 U/L (ref 39–117)
BUN: 17 mg/dL (ref 6–23)
CO2: 31 meq/L (ref 19–32)
Calcium: 9.2 mg/dL (ref 8.4–10.5)
Chloride: 105 meq/L (ref 96–112)
Creatinine, Ser: 0.87 mg/dL (ref 0.40–1.20)
GFR: 64.84 mL/min
Glucose, Bld: 116 mg/dL — ABNORMAL HIGH (ref 70–99)
Potassium: 4.1 meq/L (ref 3.5–5.1)
Sodium: 142 meq/L (ref 135–145)
Total Bilirubin: 0.6 mg/dL (ref 0.2–1.2)
Total Protein: 6.6 g/dL (ref 6.0–8.3)

## 2025-01-15 LAB — CBC
HCT: 39.1 % (ref 36.0–46.0)
Hemoglobin: 13.4 g/dL (ref 12.0–15.0)
MCHC: 34.1 g/dL (ref 30.0–36.0)
MCV: 83.2 fl (ref 78.0–100.0)
Platelets: 226 K/uL (ref 150.0–400.0)
RBC: 4.71 Mil/uL (ref 3.87–5.11)
RDW: 12.8 % (ref 11.5–15.5)
WBC: 4.7 K/uL (ref 4.0–10.5)

## 2025-01-15 LAB — HEMOGLOBIN A1C: Hgb A1c MFr Bld: 6.7 % — ABNORMAL HIGH (ref 4.6–6.5)

## 2025-01-15 LAB — LIPID PANEL
Cholesterol: 209 mg/dL — ABNORMAL HIGH (ref 28–200)
HDL: 64.5 mg/dL
LDL Cholesterol: 115 mg/dL — ABNORMAL HIGH (ref 10–99)
NonHDL: 144.32
Total CHOL/HDL Ratio: 3
Triglycerides: 147 mg/dL (ref 10.0–149.0)
VLDL: 29.4 mg/dL (ref 0.0–40.0)

## 2025-01-15 LAB — TSH: TSH: 3.13 u[IU]/mL (ref 0.35–5.50)

## 2025-01-15 LAB — VITAMIN B12: Vitamin B-12: >1500 pg/mL — ABNORMAL HIGH (ref 211–911)

## 2025-01-15 LAB — T4, FREE: Free T4: 1.26 ng/dL (ref 0.60–1.60)

## 2025-01-19 ENCOUNTER — Other Ambulatory Visit: Payer: Self-pay

## 2025-01-19 DIAGNOSIS — R7303 Prediabetes: Secondary | ICD-10-CM

## 2025-01-26 ENCOUNTER — Other Ambulatory Visit (HOSPITAL_BASED_OUTPATIENT_CLINIC_OR_DEPARTMENT_OTHER)

## 2025-02-18 ENCOUNTER — Other Ambulatory Visit (HOSPITAL_BASED_OUTPATIENT_CLINIC_OR_DEPARTMENT_OTHER)

## 2025-04-19 ENCOUNTER — Other Ambulatory Visit

## 2025-06-09 ENCOUNTER — Ambulatory Visit: Admitting: Family Medicine

## 2025-11-18 ENCOUNTER — Ambulatory Visit
